# Patient Record
Sex: Male | Born: 1960 | Race: Black or African American | Hispanic: No | Marital: Married | Smoking: Never smoker
Health system: Southern US, Community
[De-identification: ages and names within clinical notes are randomized; demographics above are authoritative.]

## PROBLEM LIST (undated history)

## (undated) DIAGNOSIS — I10 Essential (primary) hypertension: Secondary | ICD-10-CM

## (undated) HISTORY — PX: OTHER SURGICAL HISTORY: SHX169

---

## 2021-10-27 ENCOUNTER — Other Ambulatory Visit: Payer: Self-pay | Admitting: Urology

## 2021-11-13 NOTE — Progress Notes (Addendum)
Anesthesia Review:  PCP: DR Hungarland in [place]  No past EKG on pt at office of DR Hungarland.  Cardiologist : none  Chest x-ray : EKG : 11/15/21  Echo : Stress test: Cardiac Cath :  Activity level: can do a flight of stairs without difficulty  Sleep Study/ CPAP : none  Fasting Blood Sugar :      / Checks Blood Sugar -- times a day:   Blood Thinner/ Instructions /Last Dose: ASA / Instructions/ Last Dose :   81 mg Aspirin  Covid test on 11/20/21  EKG done 11/15/21 shown to [facility].  No old ekg on file at PCP office.   NO new orders given.

## 2021-11-13 NOTE — Progress Notes (Addendum)
DUE TO COVID-19 ONLY ONE VISITOR IS ALLOWED TO COME WITH YOU AND STAY IN THE WAITING ROOM ONLY DURING PRE OP AND PROCEDURE DAY OF SURGERY. THE 1 VISITOR  MAY VISIT WITH YOU AFTER SURGERY IN YOUR PRIVATE ROOM DURING VISITING HOURS ONLY!  YOU NEED TO HAVE A COVID 19 TEST ON___  11/20/21 ____ @_______ , THIS TEST MUST BE DONE BEFORE SURGERY,  COVID TESTING SITE IS AT [place]. PLEASE REMAIN IN YOUR CAR THIS IS A DRIVER UP TEST. AFTER YOUR COVID TEST PLEASE WEAR A MASK OUT IN PUBLIC AND SOCIAL DISTANCE AND [place] YOUR HANDS FREQUENTLY. PLEASE ASK ALL YOUR CLOSE CONTACTS TO WEAR A MASK OUT IN PUBLIC AND SOCIAL DISTANCE AND [place] HANDS FREQUENTLY ALSO.               Mark Figueroa  11/13/2021   Your procedure is scheduled on:  11/22/21   Report to [facility] Main  Entrance   Report to admitting at   [phone]     Call this number if you have problems the morning of surgery [phone]    Remember: Do not eat food , candy gum or mints :After Midnight. You may have clear liquids from midnight until __ 0430am  CLEAR LIQUID DIET THE DAY BEFORE SURGERY.  Mix  1/2 BOTTLE OF mIRALAX  with 32 OUNCES OF GATORADE AT 12 NOON DAY BEFORE SURGERY.  GLASS EVERY 15-30 MINUTES UNTIL GONE.  THEN REPEAT AT 6PM   ( The bottle of Miralax is 255 g.     CLEAR LIQUID DIET   Foods Allowed                                                                       Coffee and tea, regular and decaf                              Plain Jell-O any favor except red or purple                                            Fruit ices (not with fruit pulp)                                      Iced Popsicles                                     Carbonated beverages, regular and diet                                    Cranberry, grape and apple juices Sports drinks like Gatorade Lightly seasoned clear broth or consume(fat  free) Sugar   _____________________________________________________________________    BRUSH YOUR TEETH MORNING OF SURGERY AND RINSE YOUR MOUTH OUT, NO CHEWING GUM CANDY OR MINTS.     Take these medicines  the morning of surgery with A SIP OF WATER:   NONE  DO NOT TAKE ANY DIABETIC MEDICATIONS DAY OF YOUR SURGERY                               You may not have any metal on your body including hair pins and              piercings  Do not wear jewelry, make-up, lotions, powders or perfumes, deodorant             Do not wear nail polish on your fingernails.  Do not shave  48 hours prior to surgery.              Men may shave face and neck.   Do not bring valuables to the hospital. [place].  Contacts, dentures or bridgework may not be worn into surgery.  Leave suitcase in the car. After surgery it may be brought to your room.     Patients discharged the day of surgery will not be allowed to drive home. IF YOU ARE HAVING SURGERY AND GOING HOME THE SAME DAY, YOU MUST HAVE AN ADULT TO DRIVE YOU HOME AND BE WITH YOU FOR 24 HOURS. YOU MAY GO HOME BY TAXI OR UBER OR ORTHERWISE, BUT AN ADULT MUST ACCOMPANY YOU HOME AND STAY WITH YOU FOR 24 HOURS.  Name and phone number of your driver:  Special Instructions: N/A              Please read over the following fact sheets you were given: _____________________________________________________________________  [facility] - Preparing for Surgery Before surgery, you can play an important role.  Because skin is not sterile, your skin needs to be as free of germs as possible.  You can reduce the number of germs on your skin by washing with CHG (chlorahexidine gluconate) soap before surgery.  CHG is an antiseptic cleaner which kills germs and bonds with the skin to continue killing germs even after washing. Please DO NOT use if you have an allergy to CHG or antibacterial soaps.  If your skin becomes  reddened/irritated stop using the CHG and inform your nurse when you arrive at Short Stay. Do not shave (including legs and underarms) for at least 48 hours prior to the first CHG shower.  You may shave your face/neck. Please follow these instructions carefully:  1.  Shower with CHG Soap the night before surgery and the  morning of Surgery.  2.  If you choose to wash your hair, wash your hair first as usual with your  normal  shampoo.  3.  After you shampoo, rinse your hair and body thoroughly to remove the  shampoo.                           4.  Use CHG as you would any other liquid soap.  You can apply chg directly  to the skin and wash                       Gently with a scrungie or clean washcloth.  5.  Apply the CHG Soap to your body ONLY FROM THE NECK DOWN.   Do not use on face/ open  Wound or open sores. Avoid contact with eyes, ears mouth and genitals (private parts).                       Wash face,  Genitals (private parts) with your normal soap.             6.  Wash thoroughly, paying special attention to the area where your surgery  will be performed.  7.  Thoroughly rinse your body with warm water from the neck down.  8.  DO NOT shower/wash with your normal soap after using and rinsing off  the CHG Soap.                9.  Pat yourself dry with a clean towel.            10.  Wear clean pajamas.            11.  Place clean sheets on your bed the night of your first shower and do not  sleep with pets. Day of Surgery : Do not apply any lotions/deodorants the morning of surgery.  Please wear clean clothes to the hospital/surgery center.  FAILURE TO FOLLOW THESE INSTRUCTIONS MAY RESULT IN THE CANCELLATION OF YOUR SURGERY PATIENT SIGNATURE_________________________________  NURSE SIGNATURE__________________________________  ________________________________________________________________________

## 2021-11-15 ENCOUNTER — Encounter (HOSPITAL_COMMUNITY)
Admission: RE | Admit: 2021-11-15 | Discharge: 2021-11-15 | Disposition: A | Discharge status: Home / Self Care | Payer: BC Managed Care – PPO | Source: Ambulatory Visit | Attending: Urology | Admitting: Urology

## 2021-11-15 ENCOUNTER — Encounter (HOSPITAL_COMMUNITY): Payer: Self-pay

## 2021-11-15 ENCOUNTER — Other Ambulatory Visit: Payer: Self-pay

## 2021-11-15 DIAGNOSIS — Z01818 Encounter for other preprocedural examination: Secondary | ICD-10-CM | POA: Insufficient documentation

## 2021-11-15 HISTORY — DX: Essential (primary) hypertension: I10

## 2021-11-15 LAB — COMPREHENSIVE METABOLIC PANEL
ALT: 27 U/L (ref 0–44)
AST: 22 U/L (ref 15–41)
Albumin: 4.3 g/dL (ref 3.5–5.0)
Alkaline Phosphatase: 61 U/L (ref 38–126)
Anion gap: 9 (ref 5–15)
BUN: 17 mg/dL (ref 6–20)
CO2: 25 mmol/L (ref 22–32)
Calcium: 9.2 mg/dL (ref 8.9–10.3)
Chloride: 103 mmol/L (ref 98–111)
Creatinine, Ser: 0.83 mg/dL (ref 0.61–1.24)
GFR, Estimated: >60 mL/min (ref >60)
Glucose, Bld: 95 mg/dL (ref 70–99)
Potassium: 3.8 mmol/L (ref 3.5–5.1)
Sodium: 137 mmol/L (ref 135–145)
Total Bilirubin: 0.9 mg/dL (ref 0.3–1.2)
Total Protein: 7.9 g/dL (ref 6.5–8.1)

## 2021-11-15 LAB — CBC
HCT: 45.3 % (ref 39.0–52.0)
Hemoglobin: 16.1 g/dL (ref 13.0–17.0)
MCH: 27.1 pg (ref 26.0–34.0)
MCHC: 35.5 g/dL (ref 30.0–36.0)
MCV: 76.3 fL — ABNORMAL LOW (ref 80.0–100.0)
Platelets: 323 10*3/uL (ref 150–400)
RBC: 5.94 MIL/uL — ABNORMAL HIGH (ref 4.22–5.81)
RDW: 14.3 % (ref 11.5–15.5)
WBC: 6.1 10*3/uL (ref 4.0–10.5)
nRBC: 0 % (ref 0.0–0.2)

## 2021-11-16 LAB — URINE CULTURE: Culture: NO GROWTH

## 2021-11-20 ENCOUNTER — Other Ambulatory Visit: Payer: Self-pay | Admitting: Urology

## 2021-11-21 LAB — SARS CORONAVIRUS 2 (TAT 6-24 HRS): SARS Coronavirus 2: NEGATIVE

## 2021-11-21 NOTE — Anesthesia Preprocedure Evaluation (Addendum)
Anesthesia Evaluation  Patient identified by MRN, date of birth, ID band Patient awake    Reviewed: Allergy & Precautions, H&P , NPO status , Patient's Chart, lab work & pertinent test results  Airway Mallampati: I  TM Distance: >3 FB Neck ROM: Full    Dental no notable dental hx. (+) Teeth Intact, Dental Advisory Given   Pulmonary neg pulmonary ROS,    Pulmonary exam normal breath sounds clear to auscultation       Cardiovascular Exercise Tolerance: Good hypertension, Pt. on medications Normal cardiovascular exam Rhythm:Regular Rate:Normal     Neuro/Psych negative neurological ROS  negative psych ROS   GI/Hepatic negative GI ROS, Neg liver ROS,   Endo/Other  negative endocrine ROS  Renal/GU negative Renal ROS  negative genitourinary   Musculoskeletal negative musculoskeletal ROS (+)   Abdominal   Peds negative pediatric ROS (+)  Hematology negative hematology ROS (+)   Anesthesia Other Findings   Reproductive/Obstetrics negative OB ROS                            Anesthesia Physical Anesthesia Plan  ASA: 2  Anesthesia Plan: General   Post-op Pain Management: Ofirmev IV (intra-op) and Dilaudid IV   Induction: Intravenous  PONV Risk Score and Plan: 2 and Ondansetron and Dexamethasone  Airway Management Planned: Oral ETT and LMA  Additional Equipment: None  Intra-op Plan:   Post-operative Plan: Extubation in OR  Informed Consent: I have reviewed the patients History and Physical, chart, labs and discussed the procedure including the risks, benefits and alternatives for the proposed anesthesia with the patient or authorized representative who has indicated his/her understanding and acceptance.       Plan Discussed with: Anesthesiologist and CRNA  Anesthesia Plan Comments: (  )       Anesthesia Quick Evaluation

## 2021-11-22 ENCOUNTER — Encounter (HOSPITAL_COMMUNITY): Payer: Self-pay | Admitting: Urology

## 2021-11-22 ENCOUNTER — Ambulatory Visit (HOSPITAL_COMMUNITY): Payer: BC Managed Care – PPO | Admitting: Anesthesiology

## 2021-11-22 ENCOUNTER — Other Ambulatory Visit: Payer: Self-pay

## 2021-11-22 ENCOUNTER — Encounter (HOSPITAL_COMMUNITY)
Admission: RE | Disposition: A | Discharge status: Home / Self Care | Payer: Self-pay | Source: Home / Self Care | Attending: Urology

## 2021-11-22 ENCOUNTER — Ambulatory Visit (HOSPITAL_COMMUNITY): Payer: BC Managed Care – PPO | Admitting: Physician Assistant

## 2021-11-22 ENCOUNTER — Observation Stay (HOSPITAL_COMMUNITY)
Admission: RE | Admit: 2021-11-22 | Discharge: 2021-11-23 | Disposition: A | Discharge status: Home / Self Care | Payer: BC Managed Care – PPO | Attending: Urology | Admitting: Urology

## 2021-11-22 DIAGNOSIS — Z79899 Other long term (current) drug therapy: Secondary | ICD-10-CM | POA: Diagnosis not present

## 2021-11-22 DIAGNOSIS — C61 Malignant neoplasm of prostate: Principal | ICD-10-CM | POA: Insufficient documentation

## 2021-11-22 DIAGNOSIS — I10 Essential (primary) hypertension: Secondary | ICD-10-CM | POA: Diagnosis not present

## 2021-11-22 HISTORY — PX: PELVIC LYMPH NODE DISSECTION: SHX6543

## 2021-11-22 HISTORY — PX: ROBOT ASSISTED LAPAROSCOPIC RADICAL PROSTATECTOMY: SHX5141

## 2021-11-22 LAB — ABO/RH: ABO/RH(D): A POS

## 2021-11-22 LAB — CBC
HCT: 43.8 % (ref 39.0–52.0)
Hemoglobin: 15.6 g/dL (ref 13.0–17.0)
MCH: 27.4 pg (ref 26.0–34.0)
MCHC: 35.6 g/dL (ref 30.0–36.0)
MCV: 77 fL — ABNORMAL LOW (ref 80.0–100.0)
Platelets: 278 10*3/uL (ref 150–400)
RBC: 5.69 MIL/uL (ref 4.22–5.81)
RDW: 14.3 % (ref 11.5–15.5)
WBC: 17.3 10*3/uL — ABNORMAL HIGH (ref 4.0–10.5)
nRBC: 0 % (ref 0.0–0.2)

## 2021-11-22 LAB — HEMOGLOBIN AND HEMATOCRIT, BLOOD
HCT: 44 % (ref 39.0–52.0)
Hemoglobin: 15.8 g/dL (ref 13.0–17.0)

## 2021-11-22 LAB — BASIC METABOLIC PANEL
Anion gap: 11 (ref 5–15)
BUN: 14 mg/dL (ref 6–20)
CO2: 21 mmol/L — ABNORMAL LOW (ref 22–32)
Calcium: 8.7 mg/dL — ABNORMAL LOW (ref 8.9–10.3)
Chloride: 104 mmol/L (ref 98–111)
Creatinine, Ser: 1.09 mg/dL (ref 0.61–1.24)
GFR, Estimated: >60 mL/min (ref >60)
Glucose, Bld: 199 mg/dL — ABNORMAL HIGH (ref 70–99)
Potassium: 3.9 mmol/L (ref 3.5–5.1)
Sodium: 136 mmol/L (ref 135–145)

## 2021-11-22 LAB — TYPE AND SCREEN
ABO/RH(D): A POS
Antibody Screen: NEGATIVE

## 2021-11-22 SURGERY — PROSTATECTOMY, RADICAL, ROBOT-ASSISTED, LAPAROSCOPIC
Anesthesia: General | Site: Abdomen

## 2021-11-22 MED ORDER — BUPIVACAINE LIPOSOME 1.3 % IJ SUSP
INTRAMUSCULAR | Status: AC
  Filled 2021-11-22: qty 20

## 2021-11-22 MED ORDER — GLYCOPYRROLATE 0.2 MG/ML IJ SOLN
INTRAMUSCULAR | Status: DC | PRN
  Administered 2021-11-22 (×2): .1 mg via INTRAVENOUS

## 2021-11-22 MED ORDER — HYDROMORPHONE HCL 1 MG/ML IJ SOLN
0.5000 mg | INTRAMUSCULAR | Status: DC | PRN
  Administered 2021-11-22 (×2): 0.5 mg via INTRAVENOUS
  Filled 2021-11-22 (×2): qty 0.5

## 2021-11-22 MED ORDER — TRAMADOL HCL 50 MG PO TABS: 50.0000 mg | ORAL_TABLET | Freq: Four times a day (QID) | ORAL | 0 refills | Status: DC | PRN

## 2021-11-22 MED ORDER — CEFAZOLIN SODIUM-DEXTROSE 2-4 GM/100ML-% IV SOLN
2.0000 g | INTRAVENOUS | Status: AC
  Administered 2021-11-22 (×2): 2 g via INTRAVENOUS
  Filled 2021-11-22: qty 100

## 2021-11-22 MED ORDER — ACETAMINOPHEN 10 MG/ML IV SOLN
INTRAVENOUS | Status: DC | PRN
  Administered 2021-11-22: 1000 mg via INTRAVENOUS

## 2021-11-22 MED ORDER — DOCUSATE SODIUM 50 MG PO CAPS
50.0000 mg | ORAL_CAPSULE | Freq: Every day | ORAL | Status: DC | PRN
  Filled 2021-11-22: qty 1

## 2021-11-22 MED ORDER — ORAL CARE MOUTH RINSE: 15.0000 mL | Freq: Once | OROMUCOSAL | Status: AC

## 2021-11-22 MED ORDER — CHLORHEXIDINE GLUCONATE CLOTH 2 % EX PADS
6.0000 | MEDICATED_PAD | Freq: Every day | CUTANEOUS | Status: DC
  Administered 2021-11-23: 6 via TOPICAL

## 2021-11-22 MED ORDER — CEFAZOLIN SODIUM-DEXTROSE 1-4 GM/50ML-% IV SOLN
1.0000 g | Freq: Three times a day (TID) | INTRAVENOUS | Status: AC
  Administered 2021-11-22 – 2021-11-23 (×2): 1 g via INTRAVENOUS
  Filled 2021-11-22 (×2): qty 50

## 2021-11-22 MED ORDER — MIDAZOLAM HCL 5 MG/5ML IJ SOLN
INTRAMUSCULAR | Status: DC | PRN
  Administered 2021-11-22: 2 mg via INTRAVENOUS

## 2021-11-22 MED ORDER — HYDRALAZINE HCL 20 MG/ML IJ SOLN
INTRAMUSCULAR | Status: AC
  Filled 2021-11-22: qty 1

## 2021-11-22 MED ORDER — ACETAMINOPHEN 10 MG/ML IV SOLN
1000.0000 mg | Freq: Four times a day (QID) | INTRAVENOUS | Status: DC
Start: 2021-11-22 — End: 2021-11-22

## 2021-11-22 MED ORDER — HYDROMORPHONE HCL 1 MG/ML IJ SOLN
INTRAMUSCULAR | Status: DC | PRN
  Administered 2021-11-22: 1 mg via INTRAVENOUS
  Administered 2021-11-22 (×2): .5 mg via INTRAVENOUS

## 2021-11-22 MED ORDER — ESMOLOL HCL 100 MG/10ML IV SOLN
INTRAVENOUS | Status: DC | PRN
  Administered 2021-11-22 (×2): 10 mg via INTRAVENOUS

## 2021-11-22 MED ORDER — ONDANSETRON HCL 4 MG PO TABS
4.0000 mg | ORAL_TABLET | Freq: Three times a day (TID) | ORAL | Status: DC | PRN
  Administered 2021-11-22: 4 mg via ORAL
  Filled 2021-11-22: qty 1

## 2021-11-22 MED ORDER — KETAMINE HCL 10 MG/ML IJ SOLN
INTRAMUSCULAR | Status: AC
  Filled 2021-11-22: qty 1

## 2021-11-22 MED ORDER — FENTANYL CITRATE PF 50 MCG/ML IJ SOSY
25.0000 ug | PREFILLED_SYRINGE | INTRAMUSCULAR | Status: DC | PRN
  Administered 2021-11-22 (×2): 25 ug via INTRAVENOUS

## 2021-11-22 MED ORDER — ACETAMINOPHEN 10 MG/ML IV SOLN
INTRAVENOUS | Status: AC
  Filled 2021-11-22: qty 100

## 2021-11-22 MED ORDER — BUPIVACAINE LIPOSOME 1.3 % IJ SUSP
INTRAMUSCULAR | Status: DC | PRN
  Administered 2021-11-22: 20 mL

## 2021-11-22 MED ORDER — KETOROLAC TROMETHAMINE 30 MG/ML IJ SOLN
INTRAMUSCULAR | Status: DC | PRN
  Administered 2021-11-22: 30 mg via INTRAVENOUS

## 2021-11-22 MED ORDER — OXYCODONE HCL 5 MG PO TABS: 5.0000 mg | ORAL_TABLET | Freq: Once | ORAL | Status: DC | PRN

## 2021-11-22 MED ORDER — DEXMEDETOMIDINE (PRECEDEX) IN NS 20 MCG/5ML (4 MCG/ML) IV SYRINGE
PREFILLED_SYRINGE | INTRAVENOUS | Status: AC
  Filled 2021-11-22: qty 5

## 2021-11-22 MED ORDER — LACTATED RINGERS IR SOLN
Status: DC | PRN
  Administered 2021-11-22: 1000 mL

## 2021-11-22 MED ORDER — HYDRALAZINE HCL 20 MG/ML IJ SOLN
20.0000 mg | Freq: Once | INTRAMUSCULAR | Status: AC
  Administered 2021-11-22: 20 mg via INTRAVENOUS

## 2021-11-22 MED ORDER — TRAMADOL HCL 50 MG PO TABS
50.0000 mg | ORAL_TABLET | Freq: Four times a day (QID) | ORAL | Status: DC | PRN
  Administered 2021-11-23: 100 mg via ORAL
  Filled 2021-11-22: qty 2

## 2021-11-22 MED ORDER — SODIUM CHLORIDE 0.45 % IV SOLN: INTRAVENOUS | Status: DC

## 2021-11-22 MED ORDER — MEPERIDINE HCL 50 MG/ML IJ SOLN: 6.2500 mg | INTRAMUSCULAR | Status: DC | PRN

## 2021-11-22 MED ORDER — MORPHINE SULFATE (PF) 2 MG/ML IV SOLN: 2.0000 mg | INTRAVENOUS | Status: DC | PRN

## 2021-11-22 MED ORDER — OXYCODONE HCL 5 MG PO TABS: 5.0000 mg | ORAL_TABLET | ORAL | Status: DC | PRN

## 2021-11-22 MED ORDER — OXYCODONE HCL 5 MG/5ML PO SOLN: 5.0000 mg | Freq: Once | ORAL | Status: DC | PRN

## 2021-11-22 MED ORDER — HYDROCHLOROTHIAZIDE 25 MG PO TABS
25.0000 mg | ORAL_TABLET | Freq: Every day | ORAL | Status: DC
  Administered 2021-11-23: 25 mg via ORAL
  Filled 2021-11-22: qty 1

## 2021-11-22 MED ORDER — ONDANSETRON HCL 4 MG/2ML IJ SOLN: 4.0000 mg | INTRAMUSCULAR | Status: DC | PRN

## 2021-11-22 MED ORDER — KETOROLAC TROMETHAMINE 15 MG/ML IJ SOLN
15.0000 mg | Freq: Four times a day (QID) | INTRAMUSCULAR | Status: DC
  Administered 2021-11-22 – 2021-11-23 (×3): 15 mg via INTRAVENOUS
  Filled 2021-11-22 (×3): qty 1

## 2021-11-22 MED ORDER — FENTANYL CITRATE PF 50 MCG/ML IJ SOSY
PREFILLED_SYRINGE | INTRAMUSCULAR | Status: AC
  Filled 2021-11-22: qty 1

## 2021-11-22 MED ORDER — LACTATED RINGERS IV SOLN: INTRAVENOUS | Status: DC

## 2021-11-22 MED ORDER — KETOROLAC TROMETHAMINE 15 MG/ML IJ SOLN
15.0000 mg | Freq: Four times a day (QID) | INTRAMUSCULAR | Status: DC
  Administered 2021-11-22: 15 mg via INTRAVENOUS
  Filled 2021-11-22: qty 1

## 2021-11-22 MED ORDER — ACETAMINOPHEN 10 MG/ML IV SOLN
1000.0000 mg | Freq: Four times a day (QID) | INTRAVENOUS | Status: AC
  Administered 2021-11-22 – 2021-11-23 (×4): 1000 mg via INTRAVENOUS
  Filled 2021-11-22 (×4): qty 100

## 2021-11-22 MED ORDER — STERILE WATER FOR INJECTION IJ SOLN: 20.0000 mg/kg | Freq: Three times a day (TID) | INTRAMUSCULAR | Status: DC

## 2021-11-22 MED ORDER — ESMOLOL HCL 100 MG/10ML IV SOLN
INTRAVENOUS | Status: AC
  Filled 2021-11-22: qty 10

## 2021-11-22 MED ORDER — DEXAMETHASONE SODIUM PHOSPHATE 10 MG/ML IJ SOLN
INTRAMUSCULAR | Status: DC | PRN
  Administered 2021-11-22: 10 mg via INTRAVENOUS

## 2021-11-22 MED ORDER — ROCURONIUM BROMIDE 100 MG/10ML IV SOLN
INTRAVENOUS | Status: DC | PRN
Start: 2021-11-22 — End: 2021-11-22
  Administered 2021-11-22: 100 mg via INTRAVENOUS
  Administered 2021-11-22 (×2): 20 mg via INTRAVENOUS
  Administered 2021-11-22 (×2): 10 mg via INTRAVENOUS

## 2021-11-22 MED ORDER — POLYETHYLENE GLYCOL 3350 17 GM/SCOOP PO POWD: 1.0000 | Freq: Once | ORAL | Status: DC

## 2021-11-22 MED ORDER — MIDAZOLAM HCL 2 MG/2ML IJ SOLN
INTRAMUSCULAR | Status: AC
  Filled 2021-11-22: qty 2

## 2021-11-22 MED ORDER — ACETAMINOPHEN 160 MG/5ML PO SOLN: 325.0000 mg | ORAL | Status: DC | PRN

## 2021-11-22 MED ORDER — CEFAZOLIN SODIUM 1 G IJ SOLR
INTRAMUSCULAR | Status: AC
  Filled 2021-11-22: qty 20

## 2021-11-22 MED ORDER — KETOROLAC TROMETHAMINE 30 MG/ML IJ SOLN
INTRAMUSCULAR | Status: AC
  Filled 2021-11-22: qty 1

## 2021-11-22 MED ORDER — KETAMINE HCL 10 MG/ML IJ SOLN
INTRAMUSCULAR | Status: DC | PRN
  Administered 2021-11-22 (×3): 10 mg via INTRAVENOUS

## 2021-11-22 MED ORDER — CHLORHEXIDINE GLUCONATE 0.12 % MT SOLN
15.0000 mL | Freq: Once | OROMUCOSAL | Status: AC
  Administered 2021-11-22: 15 mL via OROMUCOSAL

## 2021-11-22 MED ORDER — FENTANYL CITRATE (PF) 100 MCG/2ML IJ SOLN
INTRAMUSCULAR | Status: DC | PRN
  Administered 2021-11-22 (×5): 50 ug via INTRAVENOUS

## 2021-11-22 MED ORDER — ATORVASTATIN CALCIUM 20 MG PO TABS
20.0000 mg | ORAL_TABLET | Freq: Every day | ORAL | Status: DC
  Administered 2021-11-23: 20 mg via ORAL
  Filled 2021-11-22: qty 1

## 2021-11-22 MED ORDER — PROPOFOL 10 MG/ML IV BOLUS
INTRAVENOUS | Status: DC | PRN
  Administered 2021-11-22: 160 mg via INTRAVENOUS

## 2021-11-22 MED ORDER — BUPIVACAINE-EPINEPHRINE 0.5% -1:200000 IJ SOLN
INTRAMUSCULAR | Status: DC | PRN
  Administered 2021-11-22: 37 mL
  Administered 2021-11-22: 13 mL

## 2021-11-22 MED ORDER — ONDANSETRON HCL 4 MG/2ML IJ SOLN
INTRAMUSCULAR | Status: AC
  Filled 2021-11-22: qty 2

## 2021-11-22 MED ORDER — LIDOCAINE 2% (20 MG/ML) 5 ML SYRINGE
INTRAMUSCULAR | Status: DC | PRN
  Administered 2021-11-22: 100 mg via INTRAVENOUS

## 2021-11-22 MED ORDER — ACETAMINOPHEN 325 MG PO TABS: 325.0000 mg | ORAL_TABLET | ORAL | Status: DC | PRN

## 2021-11-22 MED ORDER — LACTATED RINGERS IV SOLN: INTRAVENOUS | Status: DC | PRN

## 2021-11-22 MED ORDER — HYDROMORPHONE HCL 2 MG/ML IJ SOLN
INTRAMUSCULAR | Status: AC
  Filled 2021-11-22: qty 1

## 2021-11-22 MED ORDER — LABETALOL HCL 5 MG/ML IV SOLN
INTRAVENOUS | Status: DC | PRN
  Administered 2021-11-22: 5 mg via INTRAVENOUS

## 2021-11-22 MED ORDER — OXYBUTYNIN CHLORIDE 5 MG PO TABS: 5.0000 mg | ORAL_TABLET | Freq: Three times a day (TID) | ORAL | Status: DC | PRN

## 2021-11-22 MED ORDER — DEXMEDETOMIDINE (PRECEDEX) IN NS 20 MCG/5ML (4 MCG/ML) IV SYRINGE
PREFILLED_SYRINGE | INTRAVENOUS | Status: DC | PRN
  Administered 2021-11-22: 4 ug via INTRAVENOUS
  Administered 2021-11-22: 8 ug via INTRAVENOUS

## 2021-11-22 MED ORDER — FENTANYL CITRATE (PF) 250 MCG/5ML IJ SOLN
INTRAMUSCULAR | Status: AC
  Filled 2021-11-22: qty 5

## 2021-11-22 MED ORDER — SODIUM CHLORIDE 0.9 % IV SOLN: Freq: Once | INTRAVENOUS | Status: AC

## 2021-11-22 MED ORDER — SUGAMMADEX SODIUM 200 MG/2ML IV SOLN
INTRAVENOUS | Status: DC | PRN
Start: 2021-11-22 — End: 2021-11-22
  Administered 2021-11-22: 300 mg via INTRAVENOUS

## 2021-11-22 MED ORDER — ONDANSETRON HCL 4 MG/2ML IJ SOLN: 4.0000 mg | Freq: Once | INTRAMUSCULAR | Status: DC | PRN

## 2021-11-22 MED ORDER — 0.9 % SODIUM CHLORIDE (POUR BTL) OPTIME
TOPICAL | Status: DC | PRN
  Administered 2021-11-22: 1000 mL

## 2021-11-22 MED ORDER — ROCURONIUM BROMIDE 10 MG/ML (PF) SYRINGE
PREFILLED_SYRINGE | INTRAVENOUS | Status: AC
  Filled 2021-11-22: qty 10

## 2021-11-22 MED ORDER — ZOLPIDEM TARTRATE 5 MG PO TABS
5.0000 mg | ORAL_TABLET | Freq: Every evening | ORAL | Status: DC | PRN
  Administered 2021-11-22: 5 mg via ORAL
  Filled 2021-11-22: qty 1

## 2021-11-22 MED ORDER — PROPOFOL 10 MG/ML IV BOLUS
INTRAVENOUS | Status: AC
  Filled 2021-11-22: qty 40

## 2021-11-22 MED ORDER — HYDRALAZINE HCL 20 MG/ML IJ SOLN
5.0000 mg | INTRAMUSCULAR | Status: DC | PRN
  Administered 2021-11-22: 5 mg via INTRAVENOUS
  Filled 2021-11-22: qty 1

## 2021-11-22 MED ORDER — LIDOCAINE HCL (PF) 2 % IJ SOLN
INTRAMUSCULAR | Status: AC
  Filled 2021-11-22: qty 5

## 2021-11-22 MED ORDER — ONDANSETRON HCL 4 MG/2ML IJ SOLN
INTRAMUSCULAR | Status: DC | PRN
  Administered 2021-11-22 (×2): 4 mg via INTRAVENOUS

## 2021-11-22 MED ORDER — BUPIVACAINE-EPINEPHRINE 0.5% -1:200000 IJ SOLN
INTRAMUSCULAR | Status: AC
  Filled 2021-11-22: qty 1

## 2021-11-22 MED ORDER — SULFAMETHOXAZOLE-TRIMETHOPRIM 800-160 MG PO TABS: 1.0000 | ORAL_TABLET | Freq: Two times a day (BID) | ORAL | 0 refills | Status: DC

## 2021-11-22 MED ORDER — CEFAZOLIN SODIUM-DEXTROSE 1-4 GM/50ML-% IV SOLN
1.0000 g | Freq: Three times a day (TID) | INTRAVENOUS | Status: AC
  Filled 2021-11-22: qty 50

## 2021-11-22 SURGICAL SUPPLY — 61 items
APPLICATOR COTTON TIP 6 STRL (MISCELLANEOUS) ×2 IMPLANT
APPLICATOR COTTON TIP 6IN STRL (MISCELLANEOUS) ×3
BAG COUNTER SPONGE SURGICOUNT (BAG) IMPLANT
CATH FOLEY 2WAY SLVR 18FR 30CC (CATHETERS) ×3 IMPLANT
CATH TIEMANN FOLEY 18FR 5CC (CATHETERS) ×3 IMPLANT
CHLORAPREP W/TINT 26 (MISCELLANEOUS) ×3 IMPLANT
CLIP LIGATING HEM O LOK PURPLE (MISCELLANEOUS) ×18 IMPLANT
COVER SURGICAL LIGHT HANDLE (MISCELLANEOUS) ×3 IMPLANT
COVER TIP SHEARS 8 DVNC (MISCELLANEOUS) ×2 IMPLANT
COVER TIP SHEARS 8MM DA VINCI (MISCELLANEOUS) ×1
CUTTER ECHEON FLEX ENDO 45 340 (ENDOMECHANICALS) ×3 IMPLANT
DECANTER SPIKE VIAL GLASS SM (MISCELLANEOUS) ×3 IMPLANT
DERMABOND ADVANCED (GAUZE/BANDAGES/DRESSINGS) ×1
DERMABOND ADVANCED .7 DNX12 (GAUZE/BANDAGES/DRESSINGS) ×2 IMPLANT
DRAIN CHANNEL RND F F (WOUND CARE) IMPLANT
DRAPE ARM DVNC X/XI (DISPOSABLE) ×8 IMPLANT
DRAPE COLUMN DVNC XI (DISPOSABLE) ×2 IMPLANT
DRAPE DA VINCI XI ARM (DISPOSABLE) ×4
DRAPE DA VINCI XI COLUMN (DISPOSABLE) ×1
DRAPE SURG IRRIG POUCH 19X23 (DRAPES) ×3 IMPLANT
DRSG TEGADERM 4X4.75 (GAUZE/BANDAGES/DRESSINGS) ×3 IMPLANT
ELECT PENCIL ROCKER SW 15FT (MISCELLANEOUS) ×3 IMPLANT
ELECT REM PT RETURN 15FT ADLT (MISCELLANEOUS) ×3 IMPLANT
GAUZE 4X4 16PLY ~~LOC~~+RFID DBL (SPONGE) IMPLANT
GAUZE SPONGE 2X2 8PLY STRL LF (GAUZE/BANDAGES/DRESSINGS) IMPLANT
GAUZE SPONGE 4X4 12PLY STRL (GAUZE/BANDAGES/DRESSINGS) ×3 IMPLANT
GLOVE SURG ENC MOIS LTX SZ6.5 (GLOVE) ×3 IMPLANT
GLOVE SURG ENC TEXT LTX SZ7.5 (GLOVE) ×6 IMPLANT
GOWN STRL REUS W/TWL LRG LVL3 (GOWN DISPOSABLE) ×3 IMPLANT
GOWN STRL REUS W/TWL XL LVL3 (GOWN DISPOSABLE) ×6 IMPLANT
HOLDER FOLEY CATH W/STRAP (MISCELLANEOUS) ×3 IMPLANT
IRRIG SUCT STRYKERFLOW 2 WTIP (MISCELLANEOUS) ×3
IRRIGATION SUCT STRKRFLW 2 WTP (MISCELLANEOUS) ×2 IMPLANT
IV LACTATED RINGERS 1000ML (IV SOLUTION) ×3 IMPLANT
KIT TURNOVER KIT A (KITS) IMPLANT
PACK ROBOT UROLOGY CUSTOM (CUSTOM PROCEDURE TRAY) ×3 IMPLANT
PAD POSITIONING PINK XL (MISCELLANEOUS) ×3 IMPLANT
PENCIL SMOKE EVACUATOR (MISCELLANEOUS) IMPLANT
SCISSORS LAP 5X35 DISP (ENDOMECHANICALS) ×3 IMPLANT
SEAL CANN UNIV 5-8 DVNC XI (MISCELLANEOUS) ×8 IMPLANT
SEAL XI 5MM-8MM UNIVERSAL (MISCELLANEOUS) ×4
SET TUBE SMOKE EVAC HIGH FLOW (TUBING) ×3 IMPLANT
SOLUTION ELECTROLUBE (MISCELLANEOUS) ×3 IMPLANT
SPONGE GAUZE 2X2 STER 10/PKG (GAUZE/BANDAGES/DRESSINGS)
STAPLE RELOAD 45 GRN (STAPLE) ×2 IMPLANT
STAPLE RELOAD 45MM GREEN (STAPLE) ×1
SUT ETHILON 3 0 PS 1 (SUTURE) ×3 IMPLANT
SUT MNCRL AB 4-0 PS2 18 (SUTURE) ×6 IMPLANT
SUT PDS AB 1 CTX 36 (SUTURE) ×3 IMPLANT
SUT V-LOC BARB 180 2/0GR6 GS22 (SUTURE) ×3
SUT VIC AB 0 CT1 27 (SUTURE) ×1
SUT VIC AB 0 CT1 27XBRD ANTBC (SUTURE) ×2 IMPLANT
SUT VIC AB 2-0 PS2 27 (SUTURE) ×3 IMPLANT
SUT VICRYL 0 UR6 27IN ABS (SUTURE) ×6 IMPLANT
SUT VLOC BARB 180 ABS3/0GR12 (SUTURE) ×6
SUTURE V-LC BRB 180 2/0GR6GS22 (SUTURE) ×2 IMPLANT
SUTURE VLOC BRB 180 ABS3/0GR12 (SUTURE) ×4 IMPLANT
TOWEL OR NON WOVEN STRL DISP B (DISPOSABLE) ×3 IMPLANT
TROCAR XCEL 12X100 BLDLESS (ENDOMECHANICALS) ×3 IMPLANT
TROCAR XCEL NON-BLD 5MMX100MML (ENDOMECHANICALS) ×3 IMPLANT
WATER STERILE IRR 1000ML POUR (IV SOLUTION) ×3 IMPLANT

## 2021-11-22 NOTE — Anesthesia Procedure Notes (Signed)
Procedure Name: Intubation Date/Time: 11/22/2021 7:29 AM Performed by: Gwyndolyn Saxon, CRNA Pre-anesthesia Checklist: Patient identified, Emergency Drugs available, Suction available and Patient being monitored Patient Re-evaluated:Patient Re-evaluated prior to induction Oxygen Delivery Method: Circle system utilized Preoxygenation: Pre-oxygenation with 100% oxygen Induction Type: IV induction Ventilation: Mask ventilation without difficulty Laryngoscope Size: Miller and 2 Grade View: Grade I Tube type: Oral Tube size: 7.5 mm Number of attempts: 1 Airway Equipment and Method: Patient positioned with wedge pillow and Stylet Placement Confirmation: ETT inserted through vocal cords under direct vision, positive ETCO2 and breath sounds checked- equal and bilateral Secured at: 23 cm Tube secured with: Tape Dental Injury: Teeth and Oropharynx as per pre-operative assessment

## 2021-11-22 NOTE — Discharge Instructions (Signed)
Activity:  You are encouraged to ambulate frequently (about every hour during waking hours) to help prevent blood clots from forming in your legs or lungs.  However, you should not engage in any heavy lifting (> 10-15 lbs), strenuous activity, or straining.  Diet: You should advance your diet as instructed by your physician.  It will be normal to have some bloating, nausea, and abdominal discomfort intermittently.  Prescriptions:  You will be provided a prescription for pain medication to take as needed.  If your pain is not severe enough to require the prescription pain medication, you may take extra strength Tylenol instead which will have less side effects.  You should also take a prescribed stool softener to avoid straining with bowel movements as the prescription pain medication may constipate you.  Incisions: You may remove your dressing bandages 48 hours after surgery if not removed in the hospital.  You will either have some small staples or special tissue glue at each of the incision sites. Once the bandages are removed (if present), the incisions may stay open to air.  You may start showering (but not soaking or bathing in water) the 2nd day after surgery and the incisions simply need to be patted dry after the shower.  No additional care is needed.  What to call us about: You should call the office [phone]) if you develop fever > 101 or develop persistent vomiting. Activity:  You are encouraged to ambulate frequently (about every hour during waking hours) to help prevent blood clots from forming in your legs or lungs.  However, you should not engage in any heavy lifting (> 10-15 lbs), strenuous activity, or straining.    Foley Catheter Care A soft, flexible tube (Foley catheter) may have been placed in your bladder to drain urine and fluid. Follow these instructions: Taking Care of the Catheter Keep the area where the catheter leaves your body clean.  Attach the catheter to the leg so  there is no tension on the catheter.  Keep the drainage bag below the level of the bladder, but keep it OFF the floor.  Do not take long soaking baths. Your caregiver will give instructions about showering.  Wash your hands before touching ANYTHING related to the catheter or bag.  Using mild soap and warm water on a washcloth:  Clean the area closest to the catheter insertion site using a circular motion around the catheter.  Clean the catheter itself by wiping AWAY from the insertion site for several inches down the tube.  NEVER wipe upward as this could sweep bacteria up into the urethra (tube in your body that normally drains the bladder) and cause infection.  Place a small amount of sterile lubricant at the tip of the penis where the catheter is entering.  Taking Care of the Drainage Bags Two drainage bags may be taken home: a large overnight drainage bag, and a smaller leg bag which fits underneath clothing.  It is okay to wear the overnight bag at any time, but NEVER wear the smaller leg bag at night.  Keep the drainage bag well below the level of your bladder. This prevents backflow of urine into the bladder and allows the urine to drain freely.  Anchor the tubing to your leg to prevent pulling or tension on the catheter. Use tape or a leg strap provided by the hospital.  Empty the drainage bag when it is 1/2 to 3/4 full. Wash your hands before and after touching the bag.  Periodically  check the tubing for kinks to make sure there is no pressure on the tubing which could restrict the flow of urine.  Changing the Drainage Bags Cleanse both ends of the clean bag with alcohol before changing.  Pinch off the rubber catheter to avoid urine spillage during the disconnection.  Disconnect the dirty bag and connect the clean one.  Empty the dirty bag carefully to avoid a urine spill.  Attach the new bag to the leg with tape or a leg strap.  Cleaning the Drainage Bags Whenever a drainage bag is  disconnected, it must be cleaned quickly so it is ready for the next use.  Wash the bag in warm, soapy water.  Rinse the bag thoroughly with warm water.  Soak the bag for 30 minutes in a solution of white vinegar and water (1 cup vinegar to 1 quart warm water).  Rinse with warm water.  SEEK MEDICAL CARE IF:  You have chills or night sweats.  You are leaking around your catheter or have problems with your catheter. It is not uncommon to have sporadic leakage around your catheter as a result of bladder spasms. If the leakage stops, there is not much need for concern. If you are uncertain, call your caregiver.  You develop side effects that you think are coming from your medicines.  SEEK IMMEDIATE MEDICAL CARE IF:  You are suddenly unable to urinate. Check to see if there are any kinks in the drainage tubing that may cause this. If you cannot find any kinks, call your caregiver immediately. This is an emergency.  You develop shortness of breath or chest pains.  Bleeding persists or clots develop in your urine.  You have a fever.  You develop pain in your back or over your lower belly (abdomen).  You develop pain or swelling in your legs.  Any problems you are having get worse rather than better.  MAKE SURE YOU:  Understand these instructions.  Will watch your condition.  Will get help right away if you are not doing well or get worse.

## 2021-11-22 NOTE — Anesthesia Postprocedure Evaluation (Signed)
Anesthesia Post Note  Patient: Mark Figueroa  Procedure(s) Performed: XI ROBOTIC ASSISTED LAPAROSCOPIC RADICAL PROSTATECTOMY (Abdomen) BILATERAL PELVIC LYMPH NODE DISSECTION (Bilateral: Abdomen)     Patient location during evaluation: PACU Anesthesia Type: General Level of consciousness: awake and alert Pain management: pain level controlled Vital Signs Assessment: post-procedure vital signs reviewed and stable Respiratory status: spontaneous breathing, nonlabored ventilation, respiratory function stable and patient connected to nasal cannula oxygen Cardiovascular status: blood pressure returned to baseline and stable Postop Assessment: no apparent nausea or vomiting Anesthetic complications: no   No notable events documented.  Last Vitals:  Vitals:   11/22/21 1345 11/22/21 1350  BP: (!) 134/95 135/87  Pulse: 93 96  Resp: 12 18  Temp:    SpO2: 100% 100%    Last Pain:  Vitals:   11/22/21 1350  TempSrc:   PainSc: Asleep                 Zimal Weisensel

## 2021-11-22 NOTE — H&P (Signed)
60 year old male who presents today for further discussion of his prostate cancer. He was recently diagnosed with Gleason 4+5 equal 9 prostate cancer on his first prostate biopsy.   6/6 cores positive on the right side for Gleason 4+5 = 9 prostate cancer.   IPSS: 4, QoL 1  SHIM: 23 (with medication)   TRUS: 24 g, right lobe with several hypoechoic areas and lots of microcalcifications   The patient's past medical history is significant for hypertension. He has no past surgical history.   15-year cancer specific survival: 97%  Progression free probability after radical prostatectomy: At 5-year 50%, at 10-year 35%  Lymph nodal involvement: 20%:     ALLERGIES: No Allergies    MEDICATIONS: Aspirin Ec 81 mg tablet, delayed release  Losartan Potassium  Multivitamin  Rosuvastatin Calcium     GU PSH: Prostate Needle Biopsy - 09/07/2021     NON-GU PSH: Surgical Pathology, Gross And Microscopic Examination For Prostate Needle - 09/07/2021     GU PMH: Elevated PSA - 09/07/2021, - 07/11/2021    NON-GU PMH: Hypercholesterolemia Hypertension    FAMILY HISTORY: 1 Daughter - Daughter   SOCIAL HISTORY: Marital Status: Married Preferred Language: English; Ethnicity: Not Hispanic Or Latino; Race: Black or African American Current Smoking Status: Patient has never smoked.   Tobacco Use Assessment Completed: Used Tobacco in last 30 days? Moderate Drinker.  Drinks 1 caffeinated drink per day.    REVIEW OF SYSTEMS:    GU Review Male:   Patient reports frequent urination and hard to postpone urination. Patient denies burning/ pain with urination, get up at night to urinate, leakage of urine, stream starts and stops, trouble starting your stream, have to strain to urinate , erection problems, and penile pain.  Gastrointestinal (Upper):   Patient denies nausea, vomiting, and indigestion/ heartburn.  Gastrointestinal (Lower):   Patient denies diarrhea and constipation.  Constitutional:   Patient  denies fever, night sweats, weight loss, and fatigue.  Skin:   Patient denies skin rash/ lesion and itching.  Eyes:   Patient denies double vision and blurred vision.  Ears/ Nose/ Throat:   Patient denies sore throat and sinus problems.  Hematologic/Lymphatic:   Patient denies swollen glands and easy bruising.  Cardiovascular:   Patient denies leg swelling and chest pains.  Respiratory:   Patient reports cough. Patient denies shortness of breath.  Endocrine:   Patient denies excessive thirst.  Musculoskeletal:   Patient denies back pain and joint pain.  Neurological:   Patient denies headaches and dizziness.  Psychologic:   Patient denies depression and anxiety.   VITAL SIGNS:      10/27/2021 11:20 AM  BP 134/87 mmHg  Heart Rate 65 /min  Temperature 97.1 F / 36.1 C   Complexity of Data:  Source Of History:  Patient  Lab Test Review:   PSA  Records Review:   Pathology Reports, Previous Doctor Records, Previous Patient Records, POC Tool  Urine Test Review:   Urinalysis  X-Ray Review: Prostate Ultrasound: Reviewed Films. Discussed With Patient.     07/11/21 05/11/21  PSA  Total PSA 5.65 ng/mL 5.7 ng/ml    PROCEDURES: None   ASSESSMENT:      ICD-10 Details  1 GU:   Prostate Cancer - C61      PLAN:           Orders X-Rays: PET Scan - New diagnosis of high-grade prostate cancer          Schedule Return Visit/Planned Activity: ASAP -  PT Referral             Note: Preop for radical prostatectomy          Document Letter(s):  Created for Patient: Clinical Summary    The patient was counseled about the natural history of prostate cancer and the standard treatment options that are available for prostate cancer. It was explained to him how his age and life expectancy, clinical stage, Gleason score, and PSA affect his prognosis, the decision to proceed with additional staging studies, as well as how that information influences recommended treatment strategies. We discussed the  roles for active surveillance, radiation therapy, surgical therapy, androgen deprivation, as well as ablative therapy options for the treatment of prostate cancer as appropriate to his individual cancer situation. We discussed the risks and benefits of these options with regard to their impact on cancer control and also in terms of potential adverse events, complications, and impact on quality of life particularly related to urinary, bowel, and sexual function. The patient was encouraged to ask questions throughout the discussion today and all questions were answered to his stated satisfaction. In addition, the patient was provided with and/or directed to appropriate resources and literature for further education about prostate cancer and treatment options.   We discussed prostatectomy and specifically robotic prostatectomy with bilateral pelvic lymphadenectomy. I showed the patient on their abdomen the approximately 6 small incision (trocar) sites as well as presumed extraction sites with robotic approach as well as possible open incision sites should open conversion be necessary. We discussed peri-operative risks including bleeding, infection, deep vein thrombosis, pulmonary embolism, compartment syndrome, neuropathy / neuropraxia, heart attack, stroke, death, as well as long-term risks such as non-cure / need for additional therapy. We specifically addressed that the procedure would compromise urinary control leading to stress incontinence which typically resolves with time and pelvic rehabilitation (Kegel's, etc..), but can sometimes be permanent and require additional therapy including surgery. We also specifically addressed sexual side-effects including significant erectile dysfunction which typically partially resolves with time but can also be permanent and require additional therapy including surgery.   We discussed the typical hospital course including usual 1-2 night hospitalization, discharge with  foley catheter in place usually for 1-2 weeks before voiding trial as well as usually 2 week recovery until able to perform most non-strenuous activity and 6 weeks until able to return to most jobs and more strenuous activity such as exercise.         Notes:   I reviewed the patient's pathology report with him. I explained to him his is of high-grade nature. Given his young age I feel strongly that his best option initially is surgery, as he will likely need radiation for recurrence in the future. Ultimately, after reviewing prostate cancer and the treatment options he and his wife have opted to proceed with radical prostatectomy. He will need a PMSA scan prior to his surgery. He will also need to see our physical therapist.

## 2021-11-22 NOTE — Transfer of Care (Signed)
Immediate Anesthesia Transfer of Care Note  Patient: [name]  Procedure(s) Performed: XI ROBOTIC ASSISTED LAPAROSCOPIC RADICAL PROSTATECTOMY (Abdomen) BILATERAL PELVIC LYMPH NODE DISSECTION (Bilateral: Abdomen)  Patient Location: PACU  Anesthesia Type:General  Level of Consciousness: drowsy  Airway & Oxygen Therapy: Patient Spontanous Breathing and Patient connected to face mask oxygen  Post-op Assessment: Report given to RN and Post -op Vital signs reviewed and stable  Post vital signs: Reviewed and stable  Last Vitals:  Vitals Value Taken Time  BP 165/99 11/22/21 1246  Temp    Pulse 81 11/22/21 1253  Resp 27 11/22/21 1253  SpO2 97 % 11/22/21 1253  Vitals shown include unvalidated device data.  Last Pain:  Vitals:   11/22/21 0557  TempSrc:   PainSc: 7       Patients Stated Pain Goal: 3 (16/10/96 0454)  Complications: No notable events documented.

## 2021-11-22 NOTE — Interval H&P Note (Signed)
History and Physical Interval Note:  11/22/2021 7:13 AM  Mark Figueroa  has presented today for surgery, with the diagnosis of PROSTATE CANCER.  The various methods of treatment have been discussed with the patient and family. After consideration of risks, benefits and other options for treatment, the patient has consented to  Procedure(s) with comments: XI [place] (N/A) - REQUESTING 4.5 HRS BILATERAL PELVIC LYMPH NODE DISSECTION (Bilateral) as a surgical intervention.  The patient's history has been reviewed, patient examined, no change in status, stable for surgery.  I have reviewed the patient's chart and labs.  Questions were answered to the patient's satisfaction.     Ardis Hughs

## 2021-11-22 NOTE — Op Note (Signed)
Preoperative diagnosis:  Prostate Cancer  Umbilical hernia  Postoperative diagnosis:  same   Procedure: Robotic assisted laparoscopic radical prostatectomy Bilateral extensive pelvic lymph node dissection  Surgeon: Ardis Hughs, MD First Assistant: Aldine Contes, MD  Anesthesia: General  Complications: None  Intraoperative findings:  #1: The right posterior lateral pedicle appeared to be grossly involved with extension of his prostate cancer.  Ultimately, the margin was grossly negative. #2: I performed an extended pelvic lymph node dissection up to the bifurcation of the iliac vessels bilaterally. #3: The patient had what appeared to be a lipoma within the umbilical hernia sac.  This was resected and the defect closed primarily with a 1-0 PDS. #4: A left nerve spare was performed.  EBL: Minimal  Specimens:  #1.  Prostate and seminal vesicals #2.  Bilateral pelvic lymph nodes #3.  Umbilical hernia sac  Indication: Mark Figueroa is a 60 y.o. patient with prostate cancer.  After reviewing the management options for treatment, he elected to proceed with the removal of his prostate. We have discussed the potential benefits and risks of the procedure, side effects of the proposed treatment, the likelihood of the patient achieving the goals of the procedure, and any potential problems that might occur during the procedure or recuperation. Informed consent has been obtained.  Description of procedure:  The patient was consented in the preoperative holding area. He was in brought back to the operating room placed the table in supine position. General anesthesia was then induced and endotracheal tube was inserted. He was then placed in dorsolithotomy position and placed in steep Trendelenburg. He was then prepped and draped in the routine sterile fashion. We, the first assistant and I, then began by making a 10 mm incision supraumbilical midline incision the skin down through into the  peritoneum. Then placed a 8 mm trocar. I then inflated the abdomen and inserted the 0 robotic lens. We then placed 2 additional a 8 millimeter trochars in the patient's left lower abdomen proximally 9 cm apart and 2 trochars on the patient's right lower abdomen, one was an 8 mm trocar and the one most lateral was a 12 mm trocar which was used as the assistant port. A 5 mm trocar was placed by triangulating the 2 right lateral ports as a second assistant port. These ports were all placed under visual guidance. Once the ports were noted to be satisfactory position the robot was docked. We started with the 0 lens, monopolar scissors in the right hand and the [place] forceps the left hand as well as a fenestrated grasper as the third arm on the left-hand side.   We, the first assistant and I,  began our dissection the posterior plane incising the peritoneum at the level of the vas deferens. Isolated the left vas deferens and dissected it proximally towards the spermatic cord for 5 cm prior to ligating it. Then used this as traction to isolate the left the seminal vesicle which was then undressed bluntly and completely dissected out, all vessels were cauterized with a combination of bipolar and the monopolar scissors. We then turned our attention to the right side and similarly dissected out the right vas deferens and seminal vesicle. Once the SVs had been freed, we turned our attention to the posterior plane and bluntly dissected the tissue between the rectum and the posterior wall of the prostate bluntly out towards the apex.    At this point the bladder was taken down starting at the urachal remnant with  a combination of both blunt dissection and sharp dissection using monopolar cautery the bladder was dropped down in the usual fashion to the medial umbilical ligaments laterally and the dorsal vein of the prostate anteriorly creating our space of Retzius. We then turned our attention to the endopelvic fascia  which was incised laterally starting on the patient's right-hand side the levator muscles were pushed off the prostate laterally up towards the dorsal vein complex on the right-hand side. This process was then repeated on the left-hand side and a nice notch was created for the dorsal vein. I then used a 15mm stapler to staple the dorsal vein.   We,the first assistant and I, then located the bladder neck at the vesicoprostatic junction and using the monopolar scissors dissected down through the perivesical tissues and the bladder neck down to the prostatic urethra. The catheter was then deflated and pulled through our urethral opening and then used to retract the prostate anteriorly for the posterior bladder neck dissection. Once through the bladder neck and into the posterior plane of the prostate, the SVs were brought through the opening. The left pedicle was then isolated and systematically ligated with Weck clips and scissors. The nerve bundle was then peeled off the posterior lateral aspect of the prostate and bluntly dissected away off the prostate.  Coming around the posterior lateral pedicle of the right there appeared to be tumor involving the margins and extending into the pedicle.  I was able to get all the way around it, and the margin at this location appeared to be grossly negative once the dissection was complete.  I then came down through the dorsal venous complex anteriorly down to the membranous urethra using the monopolar. Once down to the urethra, it was transected sharply and the apex of the prostate was then dissected off the levator and rectourethralis muscles. Once the apex of the prostate had been dissected free we came back to the base of the prostate and bluntly push the rectum and nerve vascular bundle off the prostate the patient's left and used clips on the patient's right to free the prostate. Once the prostate was free it was placed off to the side. The pelvis was then irrigated  with normal saline and noted to be relatively hemostatic.  Attention was then turned to the right pelvic sidewall. The fibrofatty tissue between the external iliac vein, confluence of the iliac vessels, hypogastric artery, and Cooper's ligament was dissected free from the pelvic sidewall with care to preserve the obturator nerve. Weck clips were used for lymphostasis and hemostasis. An identical procedure was performed on the contralateral side and the lymphatic packets were removed for permanent pathologic analysis.  The prostate and both lymph node tissues were placed in the Endo Catch bag and the string brought to the 5 mm port.    I then performed an extended Rocco stitch reapproximating the posterior bladder wall adjacent to the bladder neck with tenotomy's fascia in 2 layers.  The vesicourethral anastomosis was then completed with 2 interlocking 3-0 V. lock sutures running the anastomosis in the 6:00 position to the 12:00 position on each side and then tying it off on the top. The final catheter was then passed through the patient's urethra and into the bladder and 120 cc was instilled into the bladder to test the anastomosis. As there was no leak a 64 [place] Blake drain was passed through the left lateral port and placed around the vesicourethral anastomosis. A 12 mm assistant port on  the right lateral side was then closed with 0 Vicryl with the help of the [employer] needle. The 12 mm midline infraumbilical incision was then extended another centimeter taken down and the fascia opened to remove the Endo Catch bag with the prostate specimen.  I extended this incision down to the umbilicus and opened up the fascia underneath.  We resected the hernia sac, tied it off with a 0 Vicryl suture tie.  We then reapproximated the posterior fascia with a 1-0 PDS in a running fashion.  All skin ports were closed with 4-0 Monocryl in a subcutaneous fashion. Dermabond glue was then applied to the incisions. The  drain was then secured to the skin with a 0 nylon stitch and dressing applied.   At the end of the case all laps needles and sponges had been accounted for. There no immediate complications. The patient returned to the PACU in stable condition.

## 2021-11-23 ENCOUNTER — Encounter (HOSPITAL_COMMUNITY): Payer: Self-pay | Admitting: Urology

## 2021-11-23 DIAGNOSIS — C61 Malignant neoplasm of prostate: Secondary | ICD-10-CM | POA: Diagnosis not present

## 2021-11-23 LAB — CBC
HCT: 39.4 % (ref 39.0–52.0)
Hemoglobin: 14.3 g/dL (ref 13.0–17.0)
MCH: 27.3 pg (ref 26.0–34.0)
MCHC: 36.3 g/dL — ABNORMAL HIGH (ref 30.0–36.0)
MCV: 75.2 fL — ABNORMAL LOW (ref 80.0–100.0)
Platelets: 285 10*3/uL (ref 150–400)
RBC: 5.24 MIL/uL (ref 4.22–5.81)
RDW: 14.2 % (ref 11.5–15.5)
WBC: 10.2 10*3/uL (ref 4.0–10.5)
nRBC: 0 % (ref 0.0–0.2)

## 2021-11-23 LAB — BASIC METABOLIC PANEL
Anion gap: 10 (ref 5–15)
BUN: 13 mg/dL (ref 6–20)
CO2: 24 mmol/L (ref 22–32)
Calcium: 8.9 mg/dL (ref 8.9–10.3)
Chloride: 101 mmol/L (ref 98–111)
Creatinine, Ser: 1.02 mg/dL (ref 0.61–1.24)
GFR, Estimated: >60 mL/min (ref >60)
Glucose, Bld: 114 mg/dL — ABNORMAL HIGH (ref 70–99)
Potassium: 3.5 mmol/L (ref 3.5–5.1)
Sodium: 135 mmol/L (ref 135–145)

## 2021-11-23 NOTE — Discharge Summary (Signed)
Date of admission: 11/22/2021  Date of discharge: 11/23/2021  Admission diagnosis: prostate cancer  Discharge diagnosis: prostate cancer  Secondary diagnoses:  Patient Active Problem List   Diagnosis Date Noted   Prostate cancer ([place]) 11/22/2021    Procedures performed: Procedure(s): XI ROBOTIC ASSISTED LAPAROSCOPIC RADICAL PROSTATECTOMY BILATERAL PELVIC LYMPH NODE DISSECTION  History and Physical: For full details, please see admission history and physical. Briefly, Mark Figueroa is a 60 y.o. year old patient with prostate cancer who underwent robotic assisted laparoscopic radical prostatectomy and lymph node dissection.     Hospital Course: Patient tolerated the procedure well.  He was then transferred to the floor after an uneventful PACU stay.  His hospital course was uncomplicated.  On POD#1 he had met discharge criteria: was eating a regular diet, was up and ambulating independently,  pain was well controlled, catheter was draining well, and was ready to for discharge.  JP drain removed prior to d/c.   Drain ouput: 100/110/30   Laboratory values:  Recent Labs    11/22/21 1412 11/22/21 1446 11/23/21 0342  WBC  --  17.3* 10.2  HGB 15.8 15.6 14.3  HCT 44.0 43.8 39.4   Recent Labs    11/22/21 1412 11/23/21 0342  NA 136 135  K 3.9 3.5  CL 104 101  CO2 21* 24  GLUCOSE 199* 114*  BUN 14 13  CREATININE 1.09 1.02  CALCIUM 8.7* 8.9   No results for input(s): LABPT, INR in the last 72 hours. No results for input(s): LABURIN in the last 72 hours. Results for orders placed or performed in visit on 11/20/21  SARS Coronavirus 2 (TAT 6-24 hrs)     Status: None   Collection Time: 11/20/21 12:00 AM  Result Value Ref Range Status   SARS Coronavirus 2 RESULT: NEGATIVE  Final    Comment: RESULT: NEGATIVESARS-CoV-2 INTERPRETATION:A NEGATIVE  test result means that SARS-CoV-2 RNA was not present in the specimen above the limit of detection of this test. This does not preclude a  possible SARS-CoV-2 infection and should not be used as the  sole basis for patient management decisions. Negative results must be combined with clinical observations, patient history, and epidemiological information. Optimum specimen types and timing for peak viral levels during infections caused by SARS-CoV-2  have not been determined. Collection of multiple specimens or types of specimens may be necessary to detect virus. Improper specimen collection and handling, sequence variability under primers/probes, or organism present below the limit of detection may  lead to false negative results. Positive and negative predictive values of testing are highly dependent on prevalence. False negative test results are more likely when prevalence of disease is high.The expected result is NEGATIVE.Fact S heet for  Healthcare Providers: LocalChronicle.no Sheet for Patients: SalonLookup.es Reference Range - Negative     Disposition: Home  Discharge instruction: The patient was instructed to be ambulatory but told to refrain from heavy lifting, strenuous activity, or driving.   Discharge medications:  Allergies as of 11/23/2021   No Known Allergies      Medication List     TAKE these medications    aspirin EC 81 MG tablet Take 81 mg by mouth daily. Swallow whole.   atorvastatin 20 MG tablet Commonly known as: LIPITOR Take 20 mg by mouth daily.   hydrochlorothiazide 25 MG tablet Commonly known as: HYDRODIURIL Take 25 mg by mouth daily.   losartan 100 MG tablet Commonly known as: COZAAR Take 100 mg by mouth daily.   sulfamethoxazole-trimethoprim 800-160  MG tablet Commonly known as: BACTRIM DS Take 1 tablet by mouth 2 (two) times daily. Start taking one day prior to your appointment for your first follow-up and catheter removal.  Continue taking for three days.   tadalafil 20 MG tablet Commonly known as: CIALIS Take 20 mg by  mouth daily as needed for erectile dysfunction.   traMADol 50 MG tablet Commonly known as: Ultram Take 1-2 tablets (50-100 mg total) by mouth every 6 (six) hours as needed for moderate pain.        Followup:   Follow-up Information     Karen Kays, NP Follow up on 11/29/2021.   Specialty: Nurse Practitioner Why: 10AM, Catheter removal Contact information: [address] [place] [address] [place] [address] [phone]

## 2021-11-27 LAB — SURGICAL PATHOLOGY

## 2022-05-09 ENCOUNTER — Other Ambulatory Visit (HOSPITAL_COMMUNITY): Payer: Self-pay | Admitting: Urology

## 2022-05-09 DIAGNOSIS — C61 Malignant neoplasm of prostate: Secondary | ICD-10-CM

## 2022-05-11 ENCOUNTER — Encounter (HOSPITAL_COMMUNITY): Payer: BC Managed Care – PPO

## 2022-05-14 ENCOUNTER — Encounter (HOSPITAL_COMMUNITY)
Admission: RE | Admit: 2022-05-14 | Discharge: 2022-05-14 | Disposition: A | Discharge status: Home / Self Care | Payer: BC Managed Care – PPO | Source: Ambulatory Visit | Attending: Urology | Admitting: Urology

## 2022-05-14 DIAGNOSIS — C61 Malignant neoplasm of prostate: Secondary | ICD-10-CM | POA: Insufficient documentation

## 2022-05-14 MED ORDER — PIFLIFOLASTAT F 18 (PYLARIFY) INJECTION
9.0000 | Freq: Once | INTRAVENOUS | Status: AC
  Administered 2022-05-14: 8.02 via INTRAVENOUS

## 2022-05-21 ENCOUNTER — Telehealth: Payer: Self-pay | Admitting: Oncology

## 2022-05-21 NOTE — Telephone Encounter (Signed)
Scheduled appt per 5/22 referral. Pt is aware of appt date and time. Pt is aware to arrive 15 mins prior to appt time and to bring and updated insurance card. Pt is aware of appt location.   

## 2022-06-06 ENCOUNTER — Telehealth: Payer: Self-pay | Admitting: Pharmacy Technician

## 2022-06-06 ENCOUNTER — Other Ambulatory Visit: Payer: Self-pay

## 2022-06-06 ENCOUNTER — Other Ambulatory Visit (HOSPITAL_COMMUNITY): Payer: Self-pay

## 2022-06-06 ENCOUNTER — Inpatient Hospital Stay: Payer: BC Managed Care – PPO

## 2022-06-06 ENCOUNTER — Encounter: Payer: Self-pay | Admitting: Oncology

## 2022-06-06 ENCOUNTER — Encounter (INDEPENDENT_AMBULATORY_CARE_PROVIDER_SITE_OTHER): Payer: Self-pay

## 2022-06-06 ENCOUNTER — Telehealth: Payer: Self-pay

## 2022-06-06 ENCOUNTER — Telehealth: Payer: Self-pay | Admitting: Radiation Oncology

## 2022-06-06 ENCOUNTER — Inpatient Hospital Stay: Payer: BC Managed Care – PPO | Attending: Oncology | Admitting: Oncology

## 2022-06-06 VITALS — BP 120/80 | HR 85 | Temp 97.7°F | Resp 17 | Ht 67.0 in | Wt 212.9 lb

## 2022-06-06 DIAGNOSIS — C61 Malignant neoplasm of prostate: Secondary | ICD-10-CM

## 2022-06-06 DIAGNOSIS — Z79899 Other long term (current) drug therapy: Secondary | ICD-10-CM | POA: Diagnosis not present

## 2022-06-06 DIAGNOSIS — E876 Hypokalemia: Secondary | ICD-10-CM | POA: Diagnosis not present

## 2022-06-06 DIAGNOSIS — I10 Essential (primary) hypertension: Secondary | ICD-10-CM | POA: Diagnosis not present

## 2022-06-06 LAB — CMP (CANCER CENTER ONLY)
ALT: 23 U/L (ref 0–44)
AST: 23 U/L (ref 15–41)
Albumin: 4.2 g/dL (ref 3.5–5.0)
Alkaline Phosphatase: 51 U/L (ref 38–126)
Anion gap: 10 (ref 5–15)
BUN: 22 mg/dL — ABNORMAL HIGH (ref 6–20)
CO2: 26 mmol/L (ref 22–32)
Calcium: 9.7 mg/dL (ref 8.9–10.3)
Chloride: 104 mmol/L (ref 98–111)
Creatinine: 0.93 mg/dL (ref 0.61–1.24)
GFR, Estimated: >60 mL/min (ref >60)
Glucose, Bld: 95 mg/dL (ref 70–99)
Potassium: 3.7 mmol/L (ref 3.5–5.1)
Sodium: 140 mmol/L (ref 135–145)
Total Bilirubin: 0.8 mg/dL (ref 0.3–1.2)
Total Protein: 8 g/dL (ref 6.5–8.1)

## 2022-06-06 LAB — CBC WITH DIFFERENTIAL (CANCER CENTER ONLY)
Abs Immature Granulocytes: 0.02 10*3/uL (ref 0.00–0.07)
Basophils Absolute: 0.1 10*3/uL (ref 0.0–0.1)
Basophils Relative: 1 %
Eosinophils Absolute: 0.1 10*3/uL (ref 0.0–0.5)
Eosinophils Relative: 2 %
HCT: 42.9 % (ref 39.0–52.0)
Hemoglobin: 15.9 g/dL (ref 13.0–17.0)
Immature Granulocytes: 0 %
Lymphocytes Relative: 24 %
Lymphs Abs: 1.8 10*3/uL (ref 0.7–4.0)
MCH: 27.4 pg (ref 26.0–34.0)
MCHC: 37.1 g/dL — ABNORMAL HIGH (ref 30.0–36.0)
MCV: 74 fL — ABNORMAL LOW (ref 80.0–100.0)
Monocytes Absolute: 0.7 10*3/uL (ref 0.1–1.0)
Monocytes Relative: 9 %
Neutro Abs: 4.8 10*3/uL (ref 1.7–7.7)
Neutrophils Relative %: 64 %
Platelet Count: 264 10*3/uL (ref 150–400)
RBC: 5.8 MIL/uL (ref 4.22–5.81)
RDW: 14.9 % (ref 11.5–15.5)
WBC Count: 7.5 10*3/uL (ref 4.0–10.5)
nRBC: 0 % (ref 0.0–0.2)

## 2022-06-06 MED ORDER — PREDNISONE 5 MG PO TABS: 5.0000 mg | ORAL_TABLET | Freq: Every day | ORAL | 1 refills | Status: DC

## 2022-06-06 MED ORDER — ABIRATERONE ACETATE 250 MG PO TABS
1000.0000 mg | ORAL_TABLET | Freq: Every day | ORAL | 0 refills | Status: DC
  Filled 2022-06-06: qty 120, 30d supply, fill #0

## 2022-06-06 NOTE — Telephone Encounter (Signed)
Oral Oncology Patient Advocate Encounter   Received notification that prior authorization for Abiraterone [facility]) is required.   PA submitted on 06/06/2022 Submitted by fax to [employer] at [phone] Status is pending     Lady Deutscher, [place] [facility] [place] Patient Advocate Team Direct Number: [phone]  Fax: [phone]

## 2022-06-06 NOTE — Telephone Encounter (Signed)
Oral Oncology Pharmacist Encounter  Received new prescription for abiraterone (Zytiga) for the treatment of advanced, castration-sensitive prostate cancer in conjunction with prednisone, planned duration until disease progression or unacceptable toxicity.  Labs from 6/7 (CBC) assessed, CMP in progress. Prescription dose and frequency assessed.   Current medication list in Epic reviewed, DDIs with zytiga identified: none  Evaluated chart and no patient barriers to medication adherence noted.   Patient agreement for treatment documented in MD note on 06/06/22.  Prescription has been e-scribed to the [facility] for benefits analysis and approval.  Oral Oncology Clinic will continue to follow for insurance authorization, copayment issues, initial counseling and start date.  Drema Halon, PharmD Hematology/Oncology Clinical Pharmacist [facility] [phone] 06/06/2022 2:48 PM

## 2022-06-06 NOTE — Progress Notes (Signed)
Reason for the request:   Prostate cancer  HPI: I was asked by Dr. Louis Meckel to evaluate Mark Figueroa for the evaluation of fall advanced prostate cancer.  He is a 61 year old man who was found to have an elevated PSA in September 2022.  At that time a biopsy obtained by Dr. Louis Meckel and found to have a PSA of 5.7 and a biopsy in September 2022 showed a Gleason score 4+5 = 9.  Based on these findings, he underwent robotic assisted laparoscopic radical prostatectomy and bilateral extensive lymph node dose dissection on November 22, 2021.  The final pathology showed prostate adenocarcinoma Gleason score 9 with extraprostatic extension and lymphovascular invasion.  Margins focally positive with a 0/8 total lymph nodes showed malignancy.  The final pathological staging was T3bN0.  His follow-up PSA in January 2023 was 1.18 and on April 16, 2022 was 28.  This was repeated on April 23, 2022 was 27.6.  Based on these findings he underwent PSMA pet imaging which showed soft tissue mass within the central low-attenuation along the right pelvic sidewall measuring 2.8 x 4.1 cm with SUV of 4.53 and a left pelvic sidewall lymph node measuring 1.6 x 1.5 cm with SUV of 2.84.  Clinically, he reports of feeling well overall without any major complaints.  He does have issues with incontinence although has improved but not completely dry.  He does report some mild nocturia and frequency.  He denies any bone pain or pelvic discomfort.  He does not report any headaches, blurry vision, syncope or seizures. Does not report any fevers, chills or sweats.  Does not report any cough, wheezing or hemoptysis.  Does not report any chest pain, palpitation, orthopnea or leg edema.  Does not report any nausea, vomiting or abdominal pain.  Does not report any constipation or diarrhea.  Does not report any skeletal complaints.    Does not report frequency, urgency or hematuria.  Does not report any skin rashes or lesions. Does not report any heat  or cold intolerance.  Does not report any lymphadenopathy or petechiae.  Does not report any anxiety or depression.  Remaining review of systems is negative.     Past Medical History:  Diagnosis Date   Hypertension   :   Past Surgical History:  Procedure Laterality Date   left arm biceps surgery     PELVIC LYMPH NODE DISSECTION Bilateral 11/22/2021   Procedure: BILATERAL PELVIC LYMPH NODE DISSECTION;  Surgeon: Ardis Hughs, MD;  Location: [facility];  Service: Urology;  Laterality: Bilateral;   ROBOT ASSISTED LAPAROSCOPIC RADICAL PROSTATECTOMY N/A 11/22/2021   Procedure: XI ROBOTIC ASSISTED LAPAROSCOPIC RADICAL PROSTATECTOMY;  Surgeon: Ardis Hughs, MD;  Location: [facility];  Service: Urology;  Laterality: N/A;  REQUESTING 4.5 HRS  :   Current Outpatient Medications:    aspirin EC 81 MG tablet, Take 81 mg by mouth daily. Swallow whole., Disp: , Rfl:    atorvastatin (LIPITOR) 20 MG tablet, Take 20 mg by mouth daily., Disp: , Rfl:    hydrochlorothiazide (HYDRODIURIL) 25 MG tablet, Take 25 mg by mouth daily., Disp: , Rfl:    losartan (COZAAR) 100 MG tablet, Take 100 mg by mouth daily., Disp: , Rfl:    sulfamethoxazole-trimethoprim (BACTRIM DS) 800-160 MG tablet, Take 1 tablet by mouth 2 (two) times daily. Start taking one day prior to your appointment for your first follow-up and catheter removal.  Continue taking for three days., Disp: 6 tablet, Rfl: 0   tadalafil (CIALIS) 20 MG  tablet, Take 20 mg by mouth daily as needed for erectile dysfunction., Disp: , Rfl:    traMADol (ULTRAM) 50 MG tablet, Take 1-2 tablets (50-100 mg total) by mouth every 6 (six) hours as needed for moderate pain., Disp: 30 tablet, Rfl: 0:  No Known Allergies:  No family history on file.:   Social History   Socioeconomic History   Marital status: Married    Spouse name: Not on file   Number of children: Not on file   Years of education: Not on file   Highest education level: Not on file   Occupational History   Not on file  Tobacco Use   Smoking status: Never   Smokeless tobacco: Never  Vaping Use   Vaping Use: Never used  Substance and Sexual Activity   Alcohol use: Yes    Comment: 5 cans nightly   Drug use: Never   Sexual activity: Not on file  Other Topics Concern   Not on file  Social History Narrative   Not on file   Social Determinants of Health   Financial Resource Strain: Not on file  Food Insecurity: Not on file  Transportation Needs: Not on file  Physical Activity: Not on file  Stress: Not on file  Social Connections: Not on file  Intimate Partner Violence: Not on file  :  Pertinent items are noted in HPI.  Exam: Blood pressure 120/80, pulse 85, temperature 97.7 F (36.5 C), temperature source Temporal, resp. rate 17, height '5\' 7"'  (1.702 m), weight 212 lb 14.4 oz (96.6 kg), SpO2 100 %. ECOG 1 General appearance: alert and cooperative appeared without distress. Head: atraumatic without any abnormalities. Eyes: conjunctivae/corneas clear. PERRL.  Sclera anicteric. Throat: lips, mucosa, and tongue normal; without oral thrush or ulcers. Resp: clear to auscultation bilaterally without rhonchi, wheezes or dullness to percussion. Cardio: regular rate and rhythm, S1, S2 normal, no murmur, click, rub or gallop GI: soft, non-tender; bowel sounds normal; no masses,  no organomegaly Skin: Skin color, texture, turgor normal. No rashes or lesions Lymph nodes: Cervical, supraclavicular, and axillary nodes normal. Neurologic: Grossly normal without any motor, sensory or deep tendon reflexes. Musculoskeletal: No joint deformity or effusion.    NM PET (PSMA) SKULL TO MID THIGH  Result Date: 05/16/2022 CLINICAL DATA:  Prostate carcinoma with biochemical recurrence. EXAM: NUCLEAR MEDICINE PET SKULL BASE TO THIGH TECHNIQUE: 8.02 mCi F18 Piflufolastat (Pylarify) was injected intravenously. Full-ring PET imaging was performed from the skull base to thigh after  the radiotracer. CT data was obtained and used for attenuation correction and anatomic localization. COMPARISON:  None Available. FINDINGS: NECK No radiotracer activity in neck lymph nodes. Incidental CT finding: None CHEST No radiotracer accumulation within mediastinal or hilar lymph nodes. No suspicious pulmonary nodules on the CT scan. Incidental CT finding: Cardiac enlargement. Three vessel coronary artery calcifications. Aortic atherosclerosis. Small hiatal hernia. ABDOMEN/PELVIS Prostate: No focal activity in the prostate bed. Lymph nodes: Soft tissue mass with central low attenuation along the right pelvic sidewall measures 2.8 x 4.1 cm with SUV max of 4.53, image 194/4. Left pelvic sidewall lymph node measures 1.6 by 1.5 cm with SUV max of 2.84, image 195/4. Liver: No evidence of liver metastasis Incidental CT finding: None SKELETON No focal  activity to suggest skeletal metastasis. IMPRESSION: 1. Right pelvic sidewall soft tissue mass with central low attenuation is tracer avid with SUV max of 4.53. Suspicious for necrotic lymph node metastasis. 2. Enlarged left pelvic sidewall lymph node with maximum short axis of  1.5 cm exhibits mild tracer uptake with SUV max of 2.84. Despite the mild tracer uptake morphologically this is suspicious for nodal metastasis. 3. No signs of distant metastatic disease. 4. Aortic Atherosclerosis (ICD10-I70.0). Coronary artery calcifications 5. Cardiac enlargement. Electronically Signed   By: Kerby Moors M.D.   On: 05/16/2022 08:14    Assessment and Plan:   61 year old with:  1.  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  He had initially presented with a Gleason score 9 and PSA 5 localized prostate cancer and underwent prostatectomy and November 2023.  The natural course of this disease and treatment options were discussed.   He clearly has advanced disease by high rise in his PSA and the fact that he has not achieved undetectable PSA after radical prostatectomy indicate a overall poor prognostic sign.  The treatment of this disease was discussed today in detail.  Androgen deprivation therapy remains the cornerstone for treatment of advanced prostate cancer.  Therapy escalation was also discussed and given his high risk disease.  It would be reasonable to consider that.  Risks and benefits of androgen receptor pathway inhibitors specifically abiraterone and prednisone were discussed today.  These complications including hypertension, fluid retention and fatigue.  Improvement in overall survival with median of at least 13 months has been noted with this agent in this setting.  After discussion he is agreeable to proceed.  2.  Androgen deprivation therapy: He will receive Firmagon and transition to Eligard at a later date.  Complications including weight gain and hot flashes sexual dysfunction were discussed.  3.  Genetic counseling: He will be referred for counseling given his advanced disease.  We will also obtain genomic testing to look for somatic mutation especially BRCA mutation which could be helpful in the future.  4.  Local therapy considerations: Given his limited metastatic disease, we will obtain a radiation oncology opinion regarding potential role for salvage radiation at this particular setting given his limited metastatic disease.  5.  Hypertension: Blood pressure is under excellent control and will monitor on Zytiga.  6.  Hypokalemia considerations: We will obtain baseline potassium today and will monitor closely.  He is already on hydrochlorothiazide which could exacerbate his hypocalcemia.  7.  Follow-up: We will be in the next 4 weeks to assess his progress.  60  minutes were dedicated to this visit. The time was spent on reviewing laboratory data, imaging studies, discussing treatment options,  and  answering questions regarding future plan.    A copy of this consult has been forwarded to the requesting physician.

## 2022-06-06 NOTE — Progress Notes (Signed)
Introduced myself to the patient, and his wife, as the prostate nurse navigator.  No barriers to care identified at this time.  He is here to discuss his treatment options with Dr. Alen Blew, which he decided to proceed with Zytiga and RadOnc consult.  I gave him my business card and asked him to call me with questions or concerns.  Verbalized understanding.

## 2022-06-06 NOTE — Telephone Encounter (Signed)
6/7 @ 3:08 pm Spoke to patient; was on the highway at time of call.  Patient would like a call back in about 10 mins to be schedule for consult with Dr. Tammi Klippel.

## 2022-06-07 ENCOUNTER — Telehealth: Payer: Self-pay | Admitting: Radiation Oncology

## 2022-06-07 LAB — PROSTATE-SPECIFIC AG, SERUM (LABCORP): Prostate Specific Ag, Serum: 36.8 ng/mL — ABNORMAL HIGH (ref 0.0–4.0)

## 2022-06-07 NOTE — Telephone Encounter (Signed)
F/U call; 6/8 @ 8:49 am Left voicemail for patient to call our office to be schedule for a consult with Dr. Tammi Klippel.

## 2022-06-08 ENCOUNTER — Other Ambulatory Visit (HOSPITAL_COMMUNITY): Payer: Self-pay

## 2022-06-08 MED ORDER — ABIRATERONE ACETATE 250 MG PO TABS: 1000.0000 mg | ORAL_TABLET | Freq: Every day | ORAL | 0 refills | Status: DC

## 2022-06-08 NOTE — Telephone Encounter (Signed)
Oral Oncology Patient Advocate Encounter  Prior Authorization for Abiraterone Fabio Asa) has been approved.    PA# [id number] Effective dates: 06/06/2022 through 12/06/2022  Patient must fill at [facility].    Lady Deutscher, CPhT-Adv Pharmacy Patient Advocate Specialist [place] Patient Advocate Team Direct Number: [phone]  Fax: [phone]

## 2022-06-11 NOTE — Progress Notes (Signed)
GU Location of Tumor / Histology: Prostate Ca  If Prostate Cancer, Gleason Score is (4 + 5) and PSA is (27.6 04/23/2022, 28 on 04/16/2022, and 1.18 on 11/2021)  05/14/2022 CLINICAL DATA:  Prostate carcinoma with biochemical recurrence.   FINDINGS:  NECK No radiotracer activity in neck lymph nodes.   Incidental CT finding: None   CHEST No radiotracer accumulation within mediastinal or hilar lymph nodes.  No suspicious pulmonary nodules on the CT scan.  Incidental CT finding: Cardiac enlargement. Three vessel coronary artery calcifications. Aortic atherosclerosis. Small hiatal hernia.   ABDOMEN/PELVIS   Prostate: No focal activity in the prostate bed.   Lymph nodes: Soft tissue mass with central low attenuation along the right pelvic sidewall measures 2.8 x 4.1 cm with SUV max of 4.53, image 194/4.   Left pelvic sidewall lymph node measures 1.6 by 1.5 cm with SUV max of 2.84, image 195/4.   Liver: No evidence of liver metastasis   Incidental CT finding: None   SKELETON No focal  activity to suggest skeletal metastasis.   IMPRESSION: 1. Right pelvic sidewall soft tissue mass with central low attenuation is tracer avid with SUV max of 4.53. Suspicious for necrotic lymph node metastasis. 2. Enlarged left pelvic sidewall lymph node with maximum short axis of 1.5 cm exhibits mild tracer uptake with SUV max of 2.84. Despite the mild tracer uptake morphologically this is suspicious for nodal metastasis. 3. No signs of distant metastatic disease. 4. Aortic Atherosclerosis (ICD10-I70.0). Coronary artery calcifications 5. Cardiac enlargement.    Past/Anticipated interventions by urology, if any: NA  Past/Anticipated interventions by medical oncology, if any: NA  Weight changes, if any: No   IPSS:  7 SHIM:  6  Bowel/Bladder complaints, if any:  No  Nausea/Vomiting, if any: No  Pain issues, if any:  0/10  SAFETY ISSUES: Prior radiation? No Pacemaker/ICD? No Possible current  pregnancy? Male Is the patient on methotrexate? No  Current Complaints / other details:  Robotic Radical Prostatectomy and bilateral extensive lymph node dose dissection on 11/22/2021.

## 2022-06-12 NOTE — Telephone Encounter (Signed)
Oral Chemotherapy Pharmacist Encounter  I spoke with patient for overview of: Zytiga for the treatment of advanced, castration-sensitive prostate cancer in conjunction with prednisone, planned duration until disease progression or unacceptable toxicity.   Counseled patient on administration, dosing, side effects, monitoring, drug-food interactions, safe handling, storage, and disposal.  Patient will take Zytiga '250mg'$  tablets, 4 tablets ('1000mg'$ ) by mouth once daily on an empty stomach, 1 hour before or 2 hours after a meal.  Patient states he will take his Zytiga in the morning and will wait at least 1 hour before eating.  Patient will take prednisone '5mg'$  tablet, 1 tablet by mouth one daily with breakfast.  Zytiga start date: 06/13/22  Adverse effects include but are not limited to: peripheral edema, GI upset, hypertension, hot flashes, fatigue, and arthralgias.    Prednisone prescription has been sent to [facility] on 06/06/22. Patient has already picked up prednisone and knows to start prednisone on the same day as Zytiga start.  Reviewed with patient importance of keeping a medication schedule and plan for any missed doses. No barriers to medication adherence identified.  Medication reconciliation performed and medication/allergy list updated.  Insurance authorization for Mark Figueroa has been obtained. Patient receives medication from [facility].   Patient informed the pharmacy will reach out 5-7 days prior to needing next fill of Zytiga to coordinate continued medication acquisition to prevent break in therapy.  All questions answered.  Mark Figueroa voiced understanding and appreciation.   Medication education handout placed in mail for patient. Patient knows to call the office with questions or concerns. Oral Chemotherapy Clinic phone number provided to patient.   Drema Halon, PharmD Hematology/Oncology Clinical Pharmacist [facility] [phone] 06/12/2022 12:21 PM

## 2022-06-14 ENCOUNTER — Ambulatory Visit
Admission: RE | Admit: 2022-06-14 | Discharge: 2022-06-14 | Disposition: A | Discharge status: Home / Self Care | Payer: BC Managed Care – PPO | Source: Ambulatory Visit | Attending: Radiation Oncology | Admitting: Radiation Oncology

## 2022-06-14 ENCOUNTER — Other Ambulatory Visit: Payer: Self-pay

## 2022-06-14 VITALS — BP 135/91 | HR 67 | Temp 97.0°F | Resp 18 | Ht 67.0 in | Wt 211.5 lb

## 2022-06-14 DIAGNOSIS — S46211S Strain of muscle, fascia and tendon of other parts of biceps, right arm, sequela: Secondary | ICD-10-CM | POA: Insufficient documentation

## 2022-06-14 DIAGNOSIS — Z7982 Long term (current) use of aspirin: Secondary | ICD-10-CM | POA: Diagnosis not present

## 2022-06-14 DIAGNOSIS — Z79899 Other long term (current) drug therapy: Secondary | ICD-10-CM | POA: Insufficient documentation

## 2022-06-14 DIAGNOSIS — C61 Malignant neoplasm of prostate: Secondary | ICD-10-CM

## 2022-06-14 DIAGNOSIS — Z7952 Long term (current) use of systemic steroids: Secondary | ICD-10-CM | POA: Diagnosis not present

## 2022-06-14 DIAGNOSIS — I10 Essential (primary) hypertension: Secondary | ICD-10-CM | POA: Insufficient documentation

## 2022-06-14 DIAGNOSIS — S5000XA Contusion of unspecified elbow, initial encounter: Secondary | ICD-10-CM | POA: Insufficient documentation

## 2022-06-14 NOTE — Progress Notes (Signed)
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    ICD-10-CM   1. Malignant neoplasm of prostate ([place])  C61     2. Prostate cancer [facility])  C61       HISTORY OF PRESENT ILLNESS: Mark Figueroa is a 61 y.o. male seen at the request of Dr. Alen Blew.  He was initially diagnosed with Gleason 4+5 adenocarcinoma of the prostate on biopsy with Dr. Louis Meckel 09/07/2021.  His PSA was 5.7 at diagnosis.  He elected to proceed with prostatectomy and underwent robotic assisted laparoscopic prostatectomy with bilateral lymph node dissection under the care of Dr. Louis Meckel on 11/22/2021.  Final surgical pathology confirmed PT3 pN0, Gleason 4+5 adenocarcinoma of the prostate with a focally positive margin at the right base and lymphovascular invasion but no seminal vesicle invasion or lymph node involvement.  His initial postoperative PSA on 01/22/2022 was 1.18.  A follow-up PSA was performed on 04/16/2022 and was significantly elevated at 28.2.  A confirmatory PSA was performed on 04/23/2022 and remained elevated at 27.6.  This prompted further evaluation with PSMA PET scan that was performed on 05/14/2022 and demonstrates nodal involvement in the right and left pelvic sidewall nodes but no activity in the prostate bed or other evidence of distant metastatic disease.  He met with Dr. Alen Blew on 06/06/2022 and the plan was to start [facility] ADT as well as Zytiga/prednisone.  The patient reports that he has not gotten any injections but confirmed that he did start the Zytiga/prednisone this morning. He has been  kindly referred to [place] today to discuss the role of salvage radiotherapy in the management of his advanced disease.  PREVIOUS RADIATION THERAPY: No  PAST MEDICAL HISTORY:  Past Medical History:  Diagnosis Date   Hypertension       PAST SURGICAL HISTORY: Past Surgical History:  Procedure Laterality Date   left arm biceps surgery     PELVIC LYMPH NODE DISSECTION Bilateral 11/22/2021   Procedure: BILATERAL PELVIC LYMPH NODE DISSECTION;  Surgeon: Ardis Hughs, MD;  Location: [facility];  Service: Urology;  Laterality: Bilateral;   ROBOT ASSISTED LAPAROSCOPIC RADICAL PROSTATECTOMY N/A 11/22/2021   Procedure: XI ROBOTIC ASSISTED LAPAROSCOPIC RADICAL PROSTATECTOMY;  Surgeon: Ardis Hughs, MD;  Location: [facility];  Service: Urology;  Laterality: N/A;  REQUESTING 4.5 HRS    FAMILY HISTORY: No family history on file.  SOCIAL HISTORY:  Social History   Socioeconomic History   Marital status: Married    Spouse name: Not on file   Number of children: Not on file   Years of education: Not on file   Highest education level: Not on file  Occupational History   Not on file  Tobacco Use   Smoking status: Never   Smokeless tobacco: Never  Vaping Use   Vaping Use: Never used  Substance and Sexual Activity   Alcohol use: Yes    Comment: 5 cans nightly   Drug use: Never   Sexual activity: Not on file  Other Topics Concern   Not on file  Social History Narrative   Not on file  Social Determinants of Health   Financial Resource Strain: Not on file  Food Insecurity: Not on file  Transportation Needs: Not on file  Physical Activity: Not on file  Stress: Not on file  Social Connections: Not on file  Intimate Partner Violence: Not on file    ALLERGIES: Patient has no known allergies.  MEDICATIONS:  Current Outpatient Medications  Medication Sig Dispense Refill   abiraterone acetate (ZYTIGA) 250 MG tablet Take 4 tablets (1,000 mg total) by mouth daily. Take on an empty  stomach 1 hour before or 2 hours after a meal 120 tablet 0   atorvastatin (LIPITOR) 20 MG tablet Take 20 mg by mouth daily.     hydrochlorothiazide (HYDRODIURIL) 25 MG tablet Take 25 mg by mouth daily.     losartan (COZAAR) 100 MG tablet Take 100 mg by mouth daily.     predniSONE (DELTASONE) 5 MG tablet Take 1 tablet (5 mg total) by mouth daily with breakfast. 90 tablet 1   tadalafil (CIALIS) 20 MG tablet Take 20 mg by mouth daily as needed for erectile dysfunction.     aspirin EC 81 MG tablet Take 81 mg by mouth daily. Swallow whole. (Patient not taking: Reported on 06/06/2022)     No current facility-administered medications for this encounter.    REVIEW OF SYSTEMS:  On review of systems, the patient reports that he is doing well overall.  He denies any chest pain, shortness of breath, cough, fevers, chills, night sweats, unintended weight changes.  He denies any bowel or bladder disturbances, and denies abdominal pain, nausea or vomiting.  He denies any new musculoskeletal or joint aches or pains. A complete review of systems is obtained and is otherwise negative.    PHYSICAL EXAM:  Wt Readings from Last 3 Encounters:  06/14/22 211 lb 8 oz (95.9 kg)  06/06/22 212 lb 14.4 oz (96.6 kg)  11/22/21 180 lb (81.6 kg)   Temp Readings from Last 3 Encounters:  06/14/22 (!) 97 F (36.1 C) (Temporal)  06/06/22 97.7 F (36.5 C) (Temporal)  11/23/21 99.6 F (37.6 C) (Oral)   BP Readings from Last 3 Encounters:  06/14/22 (!) 135/91  06/06/22 120/80  11/23/21 128/81   Pulse Readings from Last 3 Encounters:  06/14/22 67  06/06/22 85  11/23/21 79   Pain Assessment Pain Score: 0-No pain/10  In general this is a well appearing African-American male in no acute distress.  He's alert and oriented x4 and appropriate throughout the examination. Cardiopulmonary assessment is negative for acute distress and he exhibits normal effort.   KPS = 100  100 - Normal; no complaints; no evidence of  disease. 90   - Able to carry on normal activity; minor signs or symptoms of disease. 80   - Normal activity with effort; some signs or symptoms of disease. 37   - Cares for self; unable to carry on normal activity or to do active work. 60   - Requires occasional assistance, but is able to care for most of his personal needs. 50   - Requires considerable assistance and frequent medical care. 75   - Disabled; requires special care and assistance. 74   - Severely disabled; hospital admission is indicated although death not imminent. 16   - Very sick; hospital admission necessary; active supportive treatment necessary. 10   - Moribund; fatal processes progressing rapidly. 0     - Dead  Karnofsky DA, Abelmann WH, Craver LS and Burchenal [facility] [phone]) The use of the  nitrogen mustards in the palliative treatment of carcinoma: with particular reference to bronchogenic carcinoma Cancer 1 [id number]  LABORATORY DATA:  Lab Results  Component Value Date   WBC 7.5 06/06/2022   HGB 15.9 06/06/2022   HCT 42.9 06/06/2022   MCV 74.0 (L) 06/06/2022   PLT 264 06/06/2022   Lab Results  Component Value Date   NA 140 06/06/2022   K 3.7 06/06/2022   CL 104 06/06/2022   CO2 26 06/06/2022   Lab Results  Component Value Date   ALT 23 06/06/2022   AST 23 06/06/2022   ALKPHOS 51 06/06/2022   BILITOT 0.8 06/06/2022     RADIOGRAPHY: No results found.    IMPRESSION/PLAN: 1. 61 y.o. 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 Today, we talked to the patient and his wife about the findings and workup thus far. We discussed the natural history of oligometastatic prostate cancer and general treatment, highlighting the role of radiotherapy in the management. We discussed the available radiation techniques, and focused on the details and logistics of delivery.  The recommendation  is to proceed with a 7-1/2-week course of daily external beam radiotherapy to the prostate fossa and pelvic lymph nodes, with a boost to the PET positive pelvic nodes, concurrent with ADT and Zytiga/prednisone.  We reviewed the anticipated acute and late sequelae associated with radiation in this setting. The patient was encouraged to ask questions that were answered to his stated satisfaction.  At the conclusion of our conversation, the patient is in agreement to proceed with the recommended 7-1/2-week course of daily external beam radiotherapy to the prostate fossa and pelvic lymph nodes with a boost to the PET positive pelvic nodes, concurrent with ADT and Zytiga/prednisone.  We will follow-up with Dr. Alen Blew regarding the start of ADT since the patient reports that he has not yet received any injection.  He feels like he is still making some gradual progress in regaining his continence, working with physical therapy, and would like to allow a few additional weeks prior to beginning the radiation.  He has freely signed written consent to proceed today in the office and a copy of this document will be placed in his medical record.  We will share our discussion with Dr. Alen Blew and Dr. Louis Meckel and proceed with treatment planning accordingly.  He will be scheduled for CT simulation/treatment planning later this month or early July 2023, in anticipation of beginning his daily treatments in the next 3 to 4 weeks.  We enjoyed meeting him and his wife today and look forward to continuing to participate in his care.   We personally spent 60 minutes in this encounter including chart review, reviewing radiological studies, meeting face-to-face with the patient, entering orders and completing documentation.    Nicholos Johns, PA-C    Tyler Pita, MD  [facility] Direct Dial: [phone]  Fax: [phone] [email or web address]  Skype  LinkedIn

## 2022-06-15 ENCOUNTER — Other Ambulatory Visit: Payer: Self-pay | Admitting: Oncology

## 2022-06-15 NOTE — Progress Notes (Signed)
Patient scheduled for first ADT injection on 6/19.   RN left voicemail for patient to confirm appointment time/date.

## 2022-06-18 ENCOUNTER — Other Ambulatory Visit: Payer: Self-pay

## 2022-06-18 ENCOUNTER — Inpatient Hospital Stay: Payer: BC Managed Care – PPO

## 2022-06-18 ENCOUNTER — Encounter (HOSPITAL_COMMUNITY): Payer: Self-pay | Admitting: Oncology

## 2022-06-18 VITALS — BP 124/74 | HR 82 | Temp 98.5°F | Resp 18

## 2022-06-18 DIAGNOSIS — C61 Malignant neoplasm of prostate: Secondary | ICD-10-CM

## 2022-06-18 MED ORDER — DEGARELIX ACETATE(240 MG DOSE) 120 MG/VIAL ~~LOC~~ SOLR
240.0000 mg | Freq: Once | SUBCUTANEOUS | Status: AC
  Administered 2022-06-18: 240 mg via SUBCUTANEOUS
  Filled 2022-06-18: qty 6

## 2022-06-22 ENCOUNTER — Ambulatory Visit: Payer: BC Managed Care – PPO | Admitting: Radiation Oncology

## 2022-06-29 ENCOUNTER — Other Ambulatory Visit: Payer: Self-pay

## 2022-06-29 DIAGNOSIS — C61 Malignant neoplasm of prostate: Secondary | ICD-10-CM

## 2022-06-29 MED ORDER — ABIRATERONE ACETATE 250 MG PO TABS: 1000.0000 mg | ORAL_TABLET | Freq: Every day | ORAL | 0 refills | Status: DC

## 2022-06-29 NOTE — Telephone Encounter (Signed)
Received faxed refill request for Zytiga. Order placed with cosign request from Dr. Alen Blew.

## 2022-07-04 NOTE — Progress Notes (Signed)
  Radiation Oncology         [phone] ________________________________  Name: Mark Figueroa MRN: [id number]  Date: 07/06/2022  DOB: 1961-02-12  SIMULATION AND TREATMENT PLANNING NOTE    ICD-10-CM   1. 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  NARRATIVE:  The patient was brought to the [facility].  Identity was confirmed.  All relevant records and images related to the planned course of therapy were reviewed.  The patient freely provided informed written consent to proceed with treatment after reviewing the details related to the planned course of therapy. The consent form was witnessed and verified by the simulation staff.  Then, the patient was set-up in a stable reproducible supine position for radiation therapy.  A vacuum lock pillow device was custom fabricated to position his legs in a reproducible immobilized position.  Then, I performed a urethrogram under sterile conditions to identify the prostatic bed.  CT images were obtained.  Surface markings were placed.  The CT images were loaded into the planning software.  Then the prostate bed target, pelvic lymph node target and avoidance structures including the rectum, bladder, bowel and hips were contoured.  Treatment planning then occurred.  The radiation prescription was entered and confirmed.  A total of one complex treatment devices were fabricated. I have requested : Intensity Modulated Radiotherapy (IMRT) is medically necessary for this case for the following reason:  Rectal sparing.Marland Kitchen  PLAN:  The patient will receive 45 Gy in 25 fractions of 1.8 Gy, followed by a boost to the prostate bed to a total dose of 68.4 Gy with 13 additional fractions of 1.8 Gy with simultaneous boost to 76.2 Gy in 13 fractions of 2.4 Gy to the  involved nodes.   ________________________________  Sheral Apley Tammi Klippel, M.D.

## 2022-07-06 ENCOUNTER — Ambulatory Visit
Admission: RE | Admit: 2022-07-06 | Discharge: 2022-07-06 | Disposition: A | Discharge status: Home / Self Care | Payer: BC Managed Care – PPO | Source: Ambulatory Visit | Attending: Radiation Oncology | Admitting: Radiation Oncology

## 2022-07-06 ENCOUNTER — Other Ambulatory Visit: Payer: Self-pay

## 2022-07-06 DIAGNOSIS — C61 Malignant neoplasm of prostate: Secondary | ICD-10-CM | POA: Insufficient documentation

## 2022-07-06 DIAGNOSIS — C775 Secondary and unspecified malignant neoplasm of intrapelvic lymph nodes: Secondary | ICD-10-CM | POA: Insufficient documentation

## 2022-07-06 DIAGNOSIS — Z510 Encounter for antineoplastic radiation therapy: Secondary | ICD-10-CM | POA: Diagnosis not present

## 2022-07-10 ENCOUNTER — Inpatient Hospital Stay (HOSPITAL_BASED_OUTPATIENT_CLINIC_OR_DEPARTMENT_OTHER): Payer: BC Managed Care – PPO | Admitting: Oncology

## 2022-07-10 ENCOUNTER — Inpatient Hospital Stay: Payer: BC Managed Care – PPO | Attending: Oncology

## 2022-07-10 ENCOUNTER — Inpatient Hospital Stay: Payer: BC Managed Care – PPO

## 2022-07-10 ENCOUNTER — Other Ambulatory Visit: Payer: Self-pay

## 2022-07-10 VITALS — BP 120/81 | HR 84 | Temp 97.9°F | Resp 17 | Ht 67.0 in | Wt 209.5 lb

## 2022-07-10 DIAGNOSIS — C61 Malignant neoplasm of prostate: Secondary | ICD-10-CM

## 2022-07-10 DIAGNOSIS — E876 Hypokalemia: Secondary | ICD-10-CM | POA: Insufficient documentation

## 2022-07-10 DIAGNOSIS — Z79899 Other long term (current) drug therapy: Secondary | ICD-10-CM | POA: Insufficient documentation

## 2022-07-10 DIAGNOSIS — Z510 Encounter for antineoplastic radiation therapy: Secondary | ICD-10-CM | POA: Diagnosis not present

## 2022-07-10 DIAGNOSIS — I10 Essential (primary) hypertension: Secondary | ICD-10-CM | POA: Insufficient documentation

## 2022-07-10 LAB — CMP (CANCER CENTER ONLY)
ALT: 33 U/L (ref 0–44)
AST: 29 U/L (ref 15–41)
Albumin: 4.4 g/dL (ref 3.5–5.0)
Alkaline Phosphatase: 56 U/L (ref 38–126)
Anion gap: 7 (ref 5–15)
BUN: 20 mg/dL (ref 8–23)
CO2: 29 mmol/L (ref 22–32)
Calcium: 9.8 mg/dL (ref 8.9–10.3)
Chloride: 103 mmol/L (ref 98–111)
Creatinine: 1 mg/dL (ref 0.61–1.24)
GFR, Estimated: >60 mL/min (ref >60)
Glucose, Bld: 96 mg/dL (ref 70–99)
Potassium: 3.8 mmol/L (ref 3.5–5.1)
Sodium: 139 mmol/L (ref 135–145)
Total Bilirubin: 0.8 mg/dL (ref 0.3–1.2)
Total Protein: 7.7 g/dL (ref 6.5–8.1)

## 2022-07-10 LAB — CBC WITH DIFFERENTIAL (CANCER CENTER ONLY)
Abs Immature Granulocytes: 0.01 10*3/uL (ref 0.00–0.07)
Basophils Absolute: 0 10*3/uL (ref 0.0–0.1)
Basophils Relative: 1 %
Eosinophils Absolute: 0.1 10*3/uL (ref 0.0–0.5)
Eosinophils Relative: 1 %
HCT: 42.9 % (ref 39.0–52.0)
Hemoglobin: 15.9 g/dL (ref 13.0–17.0)
Immature Granulocytes: 0 %
Lymphocytes Relative: 26 %
Lymphs Abs: 1.4 10*3/uL (ref 0.7–4.0)
MCH: 27.6 pg (ref 26.0–34.0)
MCHC: 37.1 g/dL — ABNORMAL HIGH (ref 30.0–36.0)
MCV: 74.4 fL — ABNORMAL LOW (ref 80.0–100.0)
Monocytes Absolute: 0.5 10*3/uL (ref 0.1–1.0)
Monocytes Relative: 8 %
Neutro Abs: 3.4 10*3/uL (ref 1.7–7.7)
Neutrophils Relative %: 64 %
Platelet Count: 276 10*3/uL (ref 150–400)
RBC: 5.77 MIL/uL (ref 4.22–5.81)
RDW: 14.8 % (ref 11.5–15.5)
WBC Count: 5.3 10*3/uL (ref 4.0–10.5)
nRBC: 0 % (ref 0.0–0.2)

## 2022-07-10 MED ORDER — DEGARELIX ACETATE 80 MG ~~LOC~~ SOLR
80.0000 mg | Freq: Once | SUBCUTANEOUS | Status: AC
  Administered 2022-07-10: 80 mg via SUBCUTANEOUS
  Filled 2022-07-10: qty 4

## 2022-07-10 MED ORDER — PREDNISONE 5 MG PO TABS: 5.0000 mg | ORAL_TABLET | Freq: Every day | ORAL | 1 refills | Status: DC

## 2022-07-10 NOTE — Progress Notes (Signed)
Ok to give firmagon 1 week early per Dr Alen Blew

## 2022-07-10 NOTE — Progress Notes (Signed)
Hematology and Oncology Follow Up Visit  Mark Figueroa [id number] [birth year] 61 y.o. 07/10/2022 11:44 AM Pcp, Mark Sauer, MD   Principle Diagnosis: 61 year old with with castration-sensitive advanced disease with lymphadenopathy diagnosed in April 2023.  Initially presented with localized disease in September 2022 with Gleason score of 9.   Prior Therapy:  He underwent robotic assisted laparoscopic radical prostatectomy and bilateral extensive lymph node dose dissection on November 22, 2021.  The final pathology showed prostate adenocarcinoma Gleason score 9 with extraprostatic extension and lymphovascular invasion.  Margins focally positive with a 0/8 total lymph nodes showed malignancy.  The final pathological staging was T3bN0.    His follow-up PSA in January 2023 was 1.18 and on April 16, 2022 was 28.  This was repeated on April 23, 2022 was 27.6.  Based on these findings he underwent PSMA pet imaging which showed soft tissue mass within the central low-attenuation along the right pelvic sidewall measuring 2.8 x 4.1 cm with SUV of 4.53 and a left pelvic sidewall lymph node measuring 1.6 x 1.5 cm with SUV of 2.84.  Current therapy:   Firmagon 240 mg started on June 18, 2022.  He will receive another injection today and transition to Eligard in the future.  Zytiga 1000 mg daily with prednisone started on June 13, 2022.  Interim History: Mr. Mark Figueroa returns today for a follow-up visit.  Since the last visit, he has started Zytiga and prednisone without any major complications.  He denies any nausea, vomiting or abdominal pain.  Denies any excessive fatigue or tiredness or weakness.  He denies any hospitalizations or illnesses.  He denies any worsening edema or hyperglycemia.    Medications: I have reviewed the patient's current medications.  Current Outpatient Medications  Medication Sig Dispense Refill   abiraterone acetate (ZYTIGA) 250 MG tablet Take 4 tablets (1,000 mg total) by  mouth daily. Take on an empty stomach 1 hour before or 2 hours after a meal 120 tablet 0   aspirin EC 81 MG tablet Take 81 mg by mouth daily. Swallow whole. (Patient not taking: Reported on 06/06/2022)     atorvastatin (LIPITOR) 20 MG tablet Take 20 mg by mouth daily.     hydrochlorothiazide (HYDRODIURIL) 25 MG tablet Take 25 mg by mouth daily.     losartan (COZAAR) 100 MG tablet Take 100 mg by mouth daily.     predniSONE (DELTASONE) 5 MG tablet Take 1 tablet (5 mg total) by mouth daily with breakfast. 90 tablet 1   tadalafil (CIALIS) 20 MG tablet Take 20 mg by mouth daily as needed for erectile dysfunction.     No current facility-administered medications for this visit.     Allergies: No Known Allergies   Physical Exam: Blood pressure 120/81, pulse 84, temperature 97.9 F (36.6 C), temperature source Temporal, resp. rate 17, height '5\' 7"'$  (1.702 m), weight 209 lb 8 oz (95 kg), SpO2 97 %. ECOG: 0    General appearance: Comfortable appearing without any discomfort Head: Normocephalic without any trauma Oropharynx: Mucous membranes are moist and pink without any thrush or ulcers. Eyes: Pupils are equal and round reactive to light. Lymph nodes: No cervical, supraclavicular, inguinal or axillary lymphadenopathy.   Heart:regular rate and rhythm.  S1 and S2 without leg edema. Lung: Clear without any rhonchi or wheezes.  No dullness to percussion. Abdomin: Soft, nontender, nondistended with good bowel sounds.  No hepatosplenomegaly. Musculoskeletal: No joint deformity or effusion.  Full range of motion noted. Neurological: No deficits noted on  motor, sensory and deep tendon reflex exam. Skin: No petechial rash or dryness.  Appeared moist.  .    Lab Results: Lab Results  Component Value Date   WBC 5.3 07/10/2022   HGB 15.9 07/10/2022   HCT 42.9 07/10/2022   MCV 74.4 (L) 07/10/2022   PLT 276 07/10/2022     Chemistry      Component Value Date/Time   NA 140 06/06/2022 1441   K  3.7 06/06/2022 1441   CL 104 06/06/2022 1441   CO2 26 06/06/2022 1441   BUN 22 (H) 06/06/2022 1441   CREATININE 0.93 06/06/2022 1441      Component Value Date/Time   CALCIUM 9.7 06/06/2022 1441   ALKPHOS 51 06/06/2022 1441   AST 23 06/06/2022 1441   ALT 23 06/06/2022 1441   BILITOT 0.8 06/06/2022 1441          Impression and Plan:   61 year old with:  1.  Prostate cancer diagnosed in September 2022.  He developed castration-sensitive advanced disease with a PSA of 27.6 and PSMA PET positive scan including right side pelvic wall mass as well as left lymphadenopathy. .   He continues to tolerate Zytiga reasonably well without any complaints.  Complications including hot flashes weight gain, hypertension among others were reviewed.  He is agreeable to continue at this time.    2.  Androgen deprivation therapy: He will receive Firmagon today and transition to Eligard with the next injection.  Complications including weight gain and hot flashes and sexual dysfunction.    3.  Local therapy considerations: He is under consideration to start radiation therapy locally given his oligometastatic disease.   4.  Hypertension: No issues with blood pressure noted at this time.   5.  Hypokalemia considerations: We will continue to monitor on Zytiga.  6.  Follow-up: In 4 weeks for Eligard injection which will be repeated in 4 months.  He will have MD follow-up in 2 months.   30  minutes were spent on this visit.  The time was dedicated to reviewing laboratory data, disease status update and outlining future plan of care discussion.     Zola Button, MD 7/11/202311:44 AM

## 2022-07-10 NOTE — Addendum Note (Signed)
Addended by: Wyatt Portela on: 07/10/2022 12:35 PM   Modules accepted: Orders

## 2022-07-11 ENCOUNTER — Telehealth: Payer: Self-pay | Admitting: *Deleted

## 2022-07-11 LAB — PROSTATE-SPECIFIC AG, SERUM (LABCORP): Prostate Specific Ag, Serum: 0.9 ng/mL (ref 0.0–4.0)

## 2022-07-11 NOTE — Telephone Encounter (Signed)
PC to patient, informed him his PSA is 0.9, he verbalizes understanding.

## 2022-07-11 NOTE — Telephone Encounter (Signed)
-----   Message from Wyatt Portela, MD sent at 07/11/2022  2:41 PM EDT ----- Please let him know his PSA is down

## 2022-07-19 ENCOUNTER — Other Ambulatory Visit: Payer: Self-pay

## 2022-07-19 ENCOUNTER — Ambulatory Visit
Admission: RE | Admit: 2022-07-19 | Discharge: 2022-07-19 | Disposition: A | Discharge status: Home / Self Care | Payer: BC Managed Care – PPO | Source: Ambulatory Visit | Attending: Radiation Oncology | Admitting: Radiation Oncology

## 2022-07-19 DIAGNOSIS — Z510 Encounter for antineoplastic radiation therapy: Secondary | ICD-10-CM | POA: Diagnosis not present

## 2022-07-19 LAB — RAD ONC ARIA SESSION SUMMARY
Course Elapsed Days: 0
Plan Fractions Treated to Date: 1
Plan Prescribed Dose Per Fraction: 1.8 Gy
Plan Total Fractions Prescribed: 25
Plan Total Prescribed Dose: 45 Gy
Reference Point Dosage Given to Date: 1.8 Gy
Reference Point Session Dosage Given: 1.8 Gy
Session Number: 1

## 2022-07-20 ENCOUNTER — Other Ambulatory Visit: Payer: Self-pay

## 2022-07-20 ENCOUNTER — Ambulatory Visit
Admission: RE | Admit: 2022-07-20 | Discharge: 2022-07-20 | Disposition: A | Discharge status: Home / Self Care | Payer: BC Managed Care – PPO | Source: Ambulatory Visit | Attending: Radiation Oncology | Admitting: Radiation Oncology

## 2022-07-20 DIAGNOSIS — Z510 Encounter for antineoplastic radiation therapy: Secondary | ICD-10-CM | POA: Diagnosis not present

## 2022-07-20 LAB — RAD ONC ARIA SESSION SUMMARY
Course Elapsed Days: 1
Plan Fractions Treated to Date: 2
Plan Prescribed Dose Per Fraction: 1.8 Gy
Plan Total Fractions Prescribed: 25
Plan Total Prescribed Dose: 45 Gy
Reference Point Dosage Given to Date: 3.6 Gy
Reference Point Session Dosage Given: 1.8 Gy
Session Number: 2

## 2022-07-23 ENCOUNTER — Other Ambulatory Visit: Payer: Self-pay

## 2022-07-23 ENCOUNTER — Ambulatory Visit
Admission: RE | Admit: 2022-07-23 | Discharge: 2022-07-23 | Disposition: A | Discharge status: Home / Self Care | Payer: BC Managed Care – PPO | Source: Ambulatory Visit | Attending: Radiation Oncology | Admitting: Radiation Oncology

## 2022-07-23 DIAGNOSIS — Z510 Encounter for antineoplastic radiation therapy: Secondary | ICD-10-CM | POA: Diagnosis not present

## 2022-07-23 LAB — RAD ONC ARIA SESSION SUMMARY
Course Elapsed Days: 4
Plan Fractions Treated to Date: 3
Plan Prescribed Dose Per Fraction: 1.8 Gy
Plan Total Fractions Prescribed: 25
Plan Total Prescribed Dose: 45 Gy
Reference Point Dosage Given to Date: 5.4 Gy
Reference Point Session Dosage Given: 1.8 Gy
Session Number: 3

## 2022-07-24 ENCOUNTER — Ambulatory Visit
Admission: RE | Admit: 2022-07-24 | Discharge: 2022-07-24 | Disposition: A | Discharge status: Home / Self Care | Payer: BC Managed Care – PPO | Source: Ambulatory Visit | Attending: Radiation Oncology | Admitting: Radiation Oncology

## 2022-07-24 ENCOUNTER — Other Ambulatory Visit: Payer: Self-pay

## 2022-07-24 ENCOUNTER — Encounter: Payer: BC Managed Care – PPO | Admitting: Genetic Counselor

## 2022-07-24 ENCOUNTER — Other Ambulatory Visit: Payer: BC Managed Care – PPO

## 2022-07-24 DIAGNOSIS — Z510 Encounter for antineoplastic radiation therapy: Secondary | ICD-10-CM | POA: Diagnosis not present

## 2022-07-24 LAB — RAD ONC ARIA SESSION SUMMARY
Course Elapsed Days: 5
Plan Fractions Treated to Date: 4
Plan Prescribed Dose Per Fraction: 1.8 Gy
Plan Total Fractions Prescribed: 25
Plan Total Prescribed Dose: 45 Gy
Reference Point Dosage Given to Date: 7.2 Gy
Reference Point Session Dosage Given: 1.8 Gy
Session Number: 4

## 2022-07-25 ENCOUNTER — Other Ambulatory Visit: Payer: Self-pay

## 2022-07-25 ENCOUNTER — Ambulatory Visit
Admission: RE | Admit: 2022-07-25 | Discharge: 2022-07-25 | Disposition: A | Discharge status: Home / Self Care | Payer: BC Managed Care – PPO | Source: Ambulatory Visit | Attending: Radiation Oncology | Admitting: Radiation Oncology

## 2022-07-25 DIAGNOSIS — Z510 Encounter for antineoplastic radiation therapy: Secondary | ICD-10-CM | POA: Diagnosis not present

## 2022-07-25 LAB — RAD ONC ARIA SESSION SUMMARY
Course Elapsed Days: 6
Plan Fractions Treated to Date: 5
Plan Prescribed Dose Per Fraction: 1.8 Gy
Plan Total Fractions Prescribed: 25
Plan Total Prescribed Dose: 45 Gy
Reference Point Dosage Given to Date: 9 Gy
Reference Point Session Dosage Given: 1.8 Gy
Session Number: 5

## 2022-07-26 ENCOUNTER — Other Ambulatory Visit: Payer: Self-pay

## 2022-07-26 ENCOUNTER — Ambulatory Visit
Admission: RE | Admit: 2022-07-26 | Discharge: 2022-07-26 | Disposition: A | Discharge status: Home / Self Care | Payer: BC Managed Care – PPO | Source: Ambulatory Visit | Attending: Radiation Oncology | Admitting: Radiation Oncology

## 2022-07-26 DIAGNOSIS — Z510 Encounter for antineoplastic radiation therapy: Secondary | ICD-10-CM | POA: Diagnosis not present

## 2022-07-26 LAB — RAD ONC ARIA SESSION SUMMARY
Course Elapsed Days: 7
Plan Fractions Treated to Date: 6
Plan Prescribed Dose Per Fraction: 1.8 Gy
Plan Total Fractions Prescribed: 25
Plan Total Prescribed Dose: 45 Gy
Reference Point Dosage Given to Date: 10.8 Gy
Reference Point Session Dosage Given: 1.8 Gy
Session Number: 6

## 2022-07-27 ENCOUNTER — Ambulatory Visit
Admission: RE | Admit: 2022-07-27 | Discharge: 2022-07-27 | Disposition: A | Discharge status: Home / Self Care | Payer: BC Managed Care – PPO | Source: Ambulatory Visit | Attending: Radiation Oncology | Admitting: Radiation Oncology

## 2022-07-27 ENCOUNTER — Other Ambulatory Visit: Payer: Self-pay

## 2022-07-27 DIAGNOSIS — Z510 Encounter for antineoplastic radiation therapy: Secondary | ICD-10-CM | POA: Diagnosis not present

## 2022-07-27 LAB — RAD ONC ARIA SESSION SUMMARY
Course Elapsed Days: 8
Plan Fractions Treated to Date: 7
Plan Prescribed Dose Per Fraction: 1.8 Gy
Plan Total Fractions Prescribed: 25
Plan Total Prescribed Dose: 45 Gy
Reference Point Dosage Given to Date: 12.6 Gy
Reference Point Session Dosage Given: 1.8 Gy
Session Number: 7

## 2022-07-30 ENCOUNTER — Ambulatory Visit: Payer: BC Managed Care – PPO

## 2022-07-31 ENCOUNTER — Other Ambulatory Visit: Payer: Self-pay

## 2022-07-31 ENCOUNTER — Ambulatory Visit
Admission: RE | Admit: 2022-07-31 | Discharge: 2022-07-31 | Disposition: A | Discharge status: Home / Self Care | Payer: BC Managed Care – PPO | Source: Ambulatory Visit | Attending: Radiation Oncology | Admitting: Radiation Oncology

## 2022-07-31 DIAGNOSIS — Z510 Encounter for antineoplastic radiation therapy: Secondary | ICD-10-CM | POA: Diagnosis present

## 2022-07-31 DIAGNOSIS — C61 Malignant neoplasm of prostate: Secondary | ICD-10-CM | POA: Diagnosis present

## 2022-07-31 DIAGNOSIS — C775 Secondary and unspecified malignant neoplasm of intrapelvic lymph nodes: Secondary | ICD-10-CM | POA: Insufficient documentation

## 2022-07-31 LAB — RAD ONC ARIA SESSION SUMMARY
Course Elapsed Days: 12
Plan Fractions Treated to Date: 8
Plan Prescribed Dose Per Fraction: 1.8 Gy
Plan Total Fractions Prescribed: 25
Plan Total Prescribed Dose: 45 Gy
Reference Point Dosage Given to Date: 14.4 Gy
Reference Point Session Dosage Given: 1.8 Gy
Session Number: 8

## 2022-08-01 ENCOUNTER — Ambulatory Visit
Admission: RE | Admit: 2022-08-01 | Discharge: 2022-08-01 | Disposition: A | Discharge status: Home / Self Care | Payer: BC Managed Care – PPO | Source: Ambulatory Visit | Attending: Radiation Oncology | Admitting: Radiation Oncology

## 2022-08-01 ENCOUNTER — Other Ambulatory Visit: Payer: Self-pay

## 2022-08-01 DIAGNOSIS — Z510 Encounter for antineoplastic radiation therapy: Secondary | ICD-10-CM | POA: Diagnosis not present

## 2022-08-01 LAB — RAD ONC ARIA SESSION SUMMARY
Course Elapsed Days: 13
Plan Fractions Treated to Date: 9
Plan Prescribed Dose Per Fraction: 1.8 Gy
Plan Total Fractions Prescribed: 25
Plan Total Prescribed Dose: 45 Gy
Reference Point Dosage Given to Date: 16.2 Gy
Reference Point Session Dosage Given: 1.8 Gy
Session Number: 9

## 2022-08-02 ENCOUNTER — Other Ambulatory Visit: Payer: Self-pay

## 2022-08-02 ENCOUNTER — Ambulatory Visit
Admission: RE | Admit: 2022-08-02 | Discharge: 2022-08-02 | Disposition: A | Discharge status: Home / Self Care | Payer: BC Managed Care – PPO | Source: Ambulatory Visit | Attending: Radiation Oncology | Admitting: Radiation Oncology

## 2022-08-02 DIAGNOSIS — Z510 Encounter for antineoplastic radiation therapy: Secondary | ICD-10-CM | POA: Diagnosis not present

## 2022-08-02 LAB — RAD ONC ARIA SESSION SUMMARY
Course Elapsed Days: 14
Plan Fractions Treated to Date: 10
Plan Prescribed Dose Per Fraction: 1.8 Gy
Plan Total Fractions Prescribed: 25
Plan Total Prescribed Dose: 45 Gy
Reference Point Dosage Given to Date: 18 Gy
Reference Point Session Dosage Given: 1.8 Gy
Session Number: 10

## 2022-08-03 ENCOUNTER — Ambulatory Visit
Admission: RE | Admit: 2022-08-03 | Discharge: 2022-08-03 | Disposition: A | Discharge status: Home / Self Care | Payer: BC Managed Care – PPO | Source: Ambulatory Visit | Attending: Radiation Oncology | Admitting: Radiation Oncology

## 2022-08-03 ENCOUNTER — Other Ambulatory Visit: Payer: Self-pay

## 2022-08-03 DIAGNOSIS — Z510 Encounter for antineoplastic radiation therapy: Secondary | ICD-10-CM | POA: Diagnosis not present

## 2022-08-03 LAB — RAD ONC ARIA SESSION SUMMARY
Course Elapsed Days: 15
Plan Fractions Treated to Date: 11
Plan Prescribed Dose Per Fraction: 1.8 Gy
Plan Total Fractions Prescribed: 25
Plan Total Prescribed Dose: 45 Gy
Reference Point Dosage Given to Date: 19.8 Gy
Reference Point Session Dosage Given: 1.8 Gy
Session Number: 11

## 2022-08-06 ENCOUNTER — Other Ambulatory Visit: Payer: Self-pay

## 2022-08-06 ENCOUNTER — Ambulatory Visit
Admission: RE | Admit: 2022-08-06 | Discharge: 2022-08-06 | Disposition: A | Discharge status: Home / Self Care | Payer: BC Managed Care – PPO | Source: Ambulatory Visit | Attending: Radiation Oncology | Admitting: Radiation Oncology

## 2022-08-06 ENCOUNTER — Other Ambulatory Visit: Payer: Self-pay | Admitting: Oncology

## 2022-08-06 DIAGNOSIS — C61 Malignant neoplasm of prostate: Secondary | ICD-10-CM

## 2022-08-06 DIAGNOSIS — Z510 Encounter for antineoplastic radiation therapy: Secondary | ICD-10-CM | POA: Diagnosis not present

## 2022-08-06 LAB — RAD ONC ARIA SESSION SUMMARY
Course Elapsed Days: 18
Plan Fractions Treated to Date: 12
Plan Prescribed Dose Per Fraction: 1.8 Gy
Plan Total Fractions Prescribed: 25
Plan Total Prescribed Dose: 45 Gy
Reference Point Dosage Given to Date: 21.6 Gy
Reference Point Session Dosage Given: 1.8 Gy
Session Number: 12

## 2022-08-07 ENCOUNTER — Other Ambulatory Visit: Payer: Self-pay

## 2022-08-07 ENCOUNTER — Ambulatory Visit
Admission: RE | Admit: 2022-08-07 | Discharge: 2022-08-07 | Disposition: A | Discharge status: Home / Self Care | Payer: BC Managed Care – PPO | Source: Ambulatory Visit | Attending: Radiation Oncology | Admitting: Radiation Oncology

## 2022-08-07 DIAGNOSIS — Z510 Encounter for antineoplastic radiation therapy: Secondary | ICD-10-CM | POA: Diagnosis not present

## 2022-08-07 LAB — RAD ONC ARIA SESSION SUMMARY
Course Elapsed Days: 19
Plan Fractions Treated to Date: 13
Plan Prescribed Dose Per Fraction: 1.8 Gy
Plan Total Fractions Prescribed: 25
Plan Total Prescribed Dose: 45 Gy
Reference Point Dosage Given to Date: 23.4 Gy
Reference Point Session Dosage Given: 1.8 Gy
Session Number: 13

## 2022-08-08 ENCOUNTER — Other Ambulatory Visit: Payer: Self-pay

## 2022-08-08 ENCOUNTER — Ambulatory Visit
Admission: RE | Admit: 2022-08-08 | Discharge: 2022-08-08 | Disposition: A | Discharge status: Home / Self Care | Payer: BC Managed Care – PPO | Source: Ambulatory Visit | Attending: Radiation Oncology | Admitting: Radiation Oncology

## 2022-08-08 ENCOUNTER — Inpatient Hospital Stay: Payer: BC Managed Care – PPO | Attending: Oncology

## 2022-08-08 ENCOUNTER — Other Ambulatory Visit: Payer: Self-pay | Admitting: Oncology

## 2022-08-08 VITALS — BP 114/70 | HR 68 | Temp 99.0°F | Resp 16

## 2022-08-08 DIAGNOSIS — Z79899 Other long term (current) drug therapy: Secondary | ICD-10-CM | POA: Diagnosis not present

## 2022-08-08 DIAGNOSIS — E876 Hypokalemia: Secondary | ICD-10-CM | POA: Diagnosis not present

## 2022-08-08 DIAGNOSIS — I10 Essential (primary) hypertension: Secondary | ICD-10-CM | POA: Diagnosis not present

## 2022-08-08 DIAGNOSIS — C61 Malignant neoplasm of prostate: Secondary | ICD-10-CM | POA: Insufficient documentation

## 2022-08-08 LAB — RAD ONC ARIA SESSION SUMMARY
Course Elapsed Days: 20
Plan Fractions Treated to Date: 14
Plan Prescribed Dose Per Fraction: 1.8 Gy
Plan Total Fractions Prescribed: 25
Plan Total Prescribed Dose: 45 Gy
Reference Point Dosage Given to Date: 25.2 Gy
Reference Point Session Dosage Given: 1.8 Gy
Session Number: 14

## 2022-08-08 MED ORDER — LEUPROLIDE ACETATE (4 MONTH) 30 MG ~~LOC~~ KIT
30.0000 mg | PACK | Freq: Once | SUBCUTANEOUS | Status: AC
  Administered 2022-08-08: 30 mg via SUBCUTANEOUS
  Filled 2022-08-08: qty 30

## 2022-08-08 NOTE — Patient Instructions (Signed)
Leuprolide Suspension for Injection (Prostate Cancer) What is this medication? LEUPROLIDE (loo PROE lide) reduces the symptoms of prostate cancer. It works by decreasing levels of the hormone testosterone in the body. This prevents prostate cancer cells from spreading or growing. This medicine may be used for other purposes; ask your health care provider or pharmacist if you have questions. COMMON BRAND NAME(S): Eligard, Fensolvi, Lupron Depot, Lupron Depot-Ped, Lutrate Depot, Viadur What should I tell my care team before I take this medication? They need to know if you have any of these conditions: Diabetes Heart disease Heart failure High or low levels of electrolytes, such as magnesium, potassium, or sodium in your blood Irregular heartbeat or rhythm Seizures An unusual or allergic reaction to leuprolide, other medications, foods, dyes, or preservatives Pregnant or trying to get pregnant Breast-feeding How should I use this medication? This medication is injected under the skin or into a muscle. It is given by your care team in a hospital or clinic setting. Talk to your care team about the use of this medication in children. Special care may be needed. Overdosage: If you think you have taken too much of this medicine contact a poison control center or emergency room at once. NOTE: This medicine is only for you. Do not share this medicine with others. What if I miss a dose? Keep appointments for follow-up doses. It is important not to miss your dose. Call your care team if you are unable to keep an appointment. What may interact with this medication? Do not take this medication with any of the following: Cisapride Dronedarone Ketoconazole Levoketoconazole Pimozide Thioridazine This medication may also interact with the following: Other medications that cause heart rhythm changes This list may not describe all possible interactions. Give your health care provider a list of all the  medicines, herbs, non-prescription drugs, or dietary supplements you use. Also tell them if you smoke, drink alcohol, or use illegal drugs. Some items may interact with your medicine. What should I watch for while using this medication? Visit your care team for regular checks on your progress. Tell your care team if your symptoms do not start to get better or if they get worse. This medication may increase blood sugar. The risk may be higher in patients who already have diabetes. Ask your care team what you can do to lower the risk of diabetes while taking this medication. This medication may cause infertility. Talk to your care team if you are concerned about your fertility. Heart attacks and strokes have been reported with the use of this medication. Get emergency help if you develop signs or symptoms of a heart attack or stroke. Talk to your care team about the risks and benefits of this medication. What side effects may I notice from receiving this medication? Side effects that you should report to your care team as soon as possible: Allergic reactions--skin rash, itching, hives, swelling of the face, lips, tongue, or throat Heart attack--pain or tightness in the chest, shoulders, arms, or jaw, nausea, shortness of breath, cold or clammy skin, feeling faint or lightheaded Heart rhythm changes--fast or irregular heartbeat, dizziness, feeling faint or lightheaded, chest pain, trouble breathing High blood sugar (hyperglycemia)--increased thirst or amount of urine, unusual weakness or fatigue, blurry vision Mood swings, irritability, hostility Seizures Stroke--sudden numbness or weakness of the face, arm, or leg, trouble speaking, confusion, trouble walking, loss of balance or coordination, dizziness, severe headache, change in vision Thoughts of suicide or self-harm, worsening mood, feelings of depression  Side effects that usually do not require medical attention (report to your care team if they  continue or are bothersome): Bone pain Change in sex drive or performance General discomfort and fatigue Hot flashes Muscle pain Pain, redness, or irritation at injection site Swelling of the ankles, hands, or feet This list may not describe all possible side effects. Call your doctor for medical advice about side effects. You may report side effects to FDA at 1-800-FDA-1088. Where should I keep my medication? This medication is given in a hospital or clinic. It will not be stored at home. NOTE: This sheet is a summary. It may not cover all possible information. If you have questions about this medicine, talk to your doctor, pharmacist, or health care provider.  2023 Elsevier/Gold Standard (2022-02-26 00:00:00)

## 2022-08-09 ENCOUNTER — Ambulatory Visit
Admission: RE | Admit: 2022-08-09 | Discharge: 2022-08-09 | Disposition: A | Discharge status: Home / Self Care | Payer: BC Managed Care – PPO | Source: Ambulatory Visit | Attending: Radiation Oncology | Admitting: Radiation Oncology

## 2022-08-09 ENCOUNTER — Other Ambulatory Visit: Payer: Self-pay

## 2022-08-09 DIAGNOSIS — Z510 Encounter for antineoplastic radiation therapy: Secondary | ICD-10-CM | POA: Diagnosis not present

## 2022-08-09 LAB — RAD ONC ARIA SESSION SUMMARY
Course Elapsed Days: 21
Plan Fractions Treated to Date: 15
Plan Prescribed Dose Per Fraction: 1.8 Gy
Plan Total Fractions Prescribed: 25
Plan Total Prescribed Dose: 45 Gy
Reference Point Dosage Given to Date: 27 Gy
Reference Point Session Dosage Given: 1.8 Gy
Session Number: 15

## 2022-08-10 ENCOUNTER — Ambulatory Visit
Admission: RE | Admit: 2022-08-10 | Discharge: 2022-08-10 | Disposition: A | Discharge status: Home / Self Care | Payer: BC Managed Care – PPO | Source: Ambulatory Visit | Attending: Radiation Oncology | Admitting: Radiation Oncology

## 2022-08-10 ENCOUNTER — Other Ambulatory Visit: Payer: Self-pay

## 2022-08-10 DIAGNOSIS — Z510 Encounter for antineoplastic radiation therapy: Secondary | ICD-10-CM | POA: Diagnosis not present

## 2022-08-10 LAB — RAD ONC ARIA SESSION SUMMARY
Course Elapsed Days: 22
Plan Fractions Treated to Date: 16
Plan Prescribed Dose Per Fraction: 1.8 Gy
Plan Total Fractions Prescribed: 25
Plan Total Prescribed Dose: 45 Gy
Reference Point Dosage Given to Date: 28.8 Gy
Reference Point Session Dosage Given: 1.8 Gy
Session Number: 16

## 2022-08-13 ENCOUNTER — Other Ambulatory Visit: Payer: Self-pay

## 2022-08-13 ENCOUNTER — Ambulatory Visit
Admission: RE | Admit: 2022-08-13 | Discharge: 2022-08-13 | Disposition: A | Discharge status: Home / Self Care | Payer: BC Managed Care – PPO | Source: Ambulatory Visit | Attending: Radiation Oncology | Admitting: Radiation Oncology

## 2022-08-13 DIAGNOSIS — Z510 Encounter for antineoplastic radiation therapy: Secondary | ICD-10-CM | POA: Diagnosis not present

## 2022-08-13 LAB — RAD ONC ARIA SESSION SUMMARY
Course Elapsed Days: 25
Plan Fractions Treated to Date: 17
Plan Prescribed Dose Per Fraction: 1.8 Gy
Plan Total Fractions Prescribed: 25
Plan Total Prescribed Dose: 45 Gy
Reference Point Dosage Given to Date: 30.6 Gy
Reference Point Session Dosage Given: 1.8 Gy
Session Number: 17

## 2022-08-14 ENCOUNTER — Other Ambulatory Visit (HOSPITAL_COMMUNITY): Payer: Self-pay

## 2022-08-14 ENCOUNTER — Other Ambulatory Visit: Payer: Self-pay

## 2022-08-14 ENCOUNTER — Telehealth: Payer: Self-pay

## 2022-08-14 ENCOUNTER — Telehealth: Payer: Self-pay | Admitting: *Deleted

## 2022-08-14 ENCOUNTER — Ambulatory Visit
Admission: RE | Admit: 2022-08-14 | Discharge: 2022-08-14 | Disposition: A | Discharge status: Home / Self Care | Payer: BC Managed Care – PPO | Source: Ambulatory Visit | Attending: Radiation Oncology | Admitting: Radiation Oncology

## 2022-08-14 ENCOUNTER — Other Ambulatory Visit: Payer: Self-pay | Admitting: *Deleted

## 2022-08-14 DIAGNOSIS — C61 Malignant neoplasm of prostate: Secondary | ICD-10-CM

## 2022-08-14 DIAGNOSIS — Z510 Encounter for antineoplastic radiation therapy: Secondary | ICD-10-CM | POA: Diagnosis not present

## 2022-08-14 LAB — RAD ONC ARIA SESSION SUMMARY
Course Elapsed Days: 26
Plan Fractions Treated to Date: 18
Plan Prescribed Dose Per Fraction: 1.8 Gy
Plan Total Fractions Prescribed: 25
Plan Total Prescribed Dose: 45 Gy
Reference Point Dosage Given to Date: 32.4 Gy
Reference Point Session Dosage Given: 1.8 Gy
Session Number: 18

## 2022-08-14 MED ORDER — ABIRATERONE ACETATE 250 MG PO TABS: 1000.0000 mg | ORAL_TABLET | Freq: Every day | ORAL | 0 refills | Status: DC

## 2022-08-14 NOTE — Telephone Encounter (Signed)
Oral Chemotherapy Pharmacist Encounter  Patient must now fill medication, Zytiga, at [facility] per insurance.   Phone number: [phone]   Drema Halon, PharmD Hematology/Oncology Clinical Pharmacist [facility] [phone]

## 2022-08-14 NOTE — Telephone Encounter (Signed)
Received call from [facility], due to patient's insurance Zytiga rx must be sent to [facility].  New rx forwarded to [facility], [place], [place].

## 2022-08-15 ENCOUNTER — Ambulatory Visit: Payer: BC Managed Care – PPO

## 2022-08-15 ENCOUNTER — Ambulatory Visit
Admission: RE | Admit: 2022-08-15 | Discharge: 2022-08-15 | Disposition: A | Discharge status: Home / Self Care | Payer: BC Managed Care – PPO | Source: Ambulatory Visit | Attending: Radiation Oncology | Admitting: Radiation Oncology

## 2022-08-15 ENCOUNTER — Other Ambulatory Visit: Payer: Self-pay

## 2022-08-15 DIAGNOSIS — Z510 Encounter for antineoplastic radiation therapy: Secondary | ICD-10-CM | POA: Diagnosis not present

## 2022-08-15 LAB — RAD ONC ARIA SESSION SUMMARY
Course Elapsed Days: 27
Plan Fractions Treated to Date: 19
Plan Prescribed Dose Per Fraction: 1.8 Gy
Plan Total Fractions Prescribed: 25
Plan Total Prescribed Dose: 45 Gy
Reference Point Dosage Given to Date: 34.2 Gy
Reference Point Session Dosage Given: 1.8 Gy
Session Number: 19

## 2022-08-16 ENCOUNTER — Ambulatory Visit: Payer: BC Managed Care – PPO

## 2022-08-16 DIAGNOSIS — Z510 Encounter for antineoplastic radiation therapy: Secondary | ICD-10-CM | POA: Diagnosis not present

## 2022-08-17 ENCOUNTER — Other Ambulatory Visit: Payer: Self-pay

## 2022-08-17 ENCOUNTER — Ambulatory Visit
Admission: RE | Admit: 2022-08-17 | Discharge: 2022-08-17 | Disposition: A | Discharge status: Home / Self Care | Payer: BC Managed Care – PPO | Source: Ambulatory Visit | Attending: Radiation Oncology | Admitting: Radiation Oncology

## 2022-08-17 DIAGNOSIS — Z510 Encounter for antineoplastic radiation therapy: Secondary | ICD-10-CM | POA: Diagnosis not present

## 2022-08-17 LAB — RAD ONC ARIA SESSION SUMMARY
Course Elapsed Days: 29
Plan Fractions Treated to Date: 20
Plan Prescribed Dose Per Fraction: 1.8 Gy
Plan Total Fractions Prescribed: 25
Plan Total Prescribed Dose: 45 Gy
Reference Point Dosage Given to Date: 36 Gy
Reference Point Session Dosage Given: 1.8 Gy
Session Number: 20

## 2022-08-20 ENCOUNTER — Other Ambulatory Visit: Payer: Self-pay

## 2022-08-20 ENCOUNTER — Ambulatory Visit
Admission: RE | Admit: 2022-08-20 | Discharge: 2022-08-20 | Disposition: A | Discharge status: Home / Self Care | Payer: BC Managed Care – PPO | Source: Ambulatory Visit | Attending: Radiation Oncology | Admitting: Radiation Oncology

## 2022-08-20 DIAGNOSIS — Z510 Encounter for antineoplastic radiation therapy: Secondary | ICD-10-CM | POA: Diagnosis not present

## 2022-08-20 LAB — RAD ONC ARIA SESSION SUMMARY
Course Elapsed Days: 32
Plan Fractions Treated to Date: 21
Plan Prescribed Dose Per Fraction: 1.8 Gy
Plan Total Fractions Prescribed: 25
Plan Total Prescribed Dose: 45 Gy
Reference Point Dosage Given to Date: 37.8 Gy
Reference Point Session Dosage Given: 1.8 Gy
Session Number: 21

## 2022-08-21 ENCOUNTER — Other Ambulatory Visit: Payer: Self-pay

## 2022-08-21 ENCOUNTER — Other Ambulatory Visit: Payer: Self-pay | Admitting: Oncology

## 2022-08-21 ENCOUNTER — Ambulatory Visit
Admission: RE | Admit: 2022-08-21 | Discharge: 2022-08-21 | Disposition: A | Discharge status: Home / Self Care | Payer: BC Managed Care – PPO | Source: Ambulatory Visit | Attending: Radiation Oncology | Admitting: Radiation Oncology

## 2022-08-21 DIAGNOSIS — Z510 Encounter for antineoplastic radiation therapy: Secondary | ICD-10-CM | POA: Diagnosis not present

## 2022-08-21 DIAGNOSIS — C61 Malignant neoplasm of prostate: Secondary | ICD-10-CM

## 2022-08-21 LAB — RAD ONC ARIA SESSION SUMMARY
Course Elapsed Days: 33
Plan Fractions Treated to Date: 22
Plan Prescribed Dose Per Fraction: 1.8 Gy
Plan Total Fractions Prescribed: 25
Plan Total Prescribed Dose: 45 Gy
Reference Point Dosage Given to Date: 39.6 Gy
Reference Point Session Dosage Given: 1.8 Gy
Session Number: 22

## 2022-08-22 ENCOUNTER — Ambulatory Visit: Payer: BC Managed Care – PPO

## 2022-08-22 ENCOUNTER — Other Ambulatory Visit: Payer: Self-pay

## 2022-08-22 DIAGNOSIS — Z510 Encounter for antineoplastic radiation therapy: Secondary | ICD-10-CM | POA: Diagnosis not present

## 2022-08-22 LAB — RAD ONC ARIA SESSION SUMMARY
Course Elapsed Days: 34
Plan Fractions Treated to Date: 23
Plan Prescribed Dose Per Fraction: 1.8 Gy
Plan Total Fractions Prescribed: 25
Plan Total Prescribed Dose: 45 Gy
Reference Point Dosage Given to Date: 41.4 Gy
Reference Point Session Dosage Given: 1.8 Gy
Session Number: 23

## 2022-08-23 ENCOUNTER — Other Ambulatory Visit: Payer: Self-pay

## 2022-08-23 ENCOUNTER — Ambulatory Visit: Payer: BC Managed Care – PPO

## 2022-08-23 DIAGNOSIS — Z510 Encounter for antineoplastic radiation therapy: Secondary | ICD-10-CM | POA: Diagnosis not present

## 2022-08-23 LAB — RAD ONC ARIA SESSION SUMMARY
Course Elapsed Days: 35
Plan Fractions Treated to Date: 24
Plan Prescribed Dose Per Fraction: 1.8 Gy
Plan Total Fractions Prescribed: 25
Plan Total Prescribed Dose: 45 Gy
Reference Point Dosage Given to Date: 43.2 Gy
Reference Point Session Dosage Given: 1.8 Gy
Session Number: 24

## 2022-08-24 ENCOUNTER — Ambulatory Visit
Admission: RE | Admit: 2022-08-24 | Discharge: 2022-08-24 | Disposition: A | Discharge status: Home / Self Care | Payer: BC Managed Care – PPO | Source: Ambulatory Visit | Attending: Radiation Oncology | Admitting: Radiation Oncology

## 2022-08-24 ENCOUNTER — Other Ambulatory Visit: Payer: Self-pay

## 2022-08-24 ENCOUNTER — Ambulatory Visit: Payer: BC Managed Care – PPO

## 2022-08-24 DIAGNOSIS — Z510 Encounter for antineoplastic radiation therapy: Secondary | ICD-10-CM | POA: Diagnosis not present

## 2022-08-24 LAB — RAD ONC ARIA SESSION SUMMARY
Course Elapsed Days: 36
Plan Fractions Treated to Date: 25
Plan Prescribed Dose Per Fraction: 1.8 Gy
Plan Total Fractions Prescribed: 25
Plan Total Prescribed Dose: 45 Gy
Reference Point Dosage Given to Date: 45 Gy
Reference Point Session Dosage Given: 1.8 Gy
Session Number: 25

## 2022-08-27 ENCOUNTER — Ambulatory Visit
Admission: RE | Admit: 2022-08-27 | Discharge: 2022-08-27 | Disposition: A | Discharge status: Home / Self Care | Payer: BC Managed Care – PPO | Source: Ambulatory Visit | Attending: Radiation Oncology | Admitting: Radiation Oncology

## 2022-08-27 ENCOUNTER — Other Ambulatory Visit: Payer: Self-pay

## 2022-08-27 DIAGNOSIS — Z510 Encounter for antineoplastic radiation therapy: Secondary | ICD-10-CM | POA: Diagnosis not present

## 2022-08-27 LAB — RAD ONC ARIA SESSION SUMMARY
Course Elapsed Days: 39
Plan Fractions Treated to Date: 1
Plan Prescribed Dose Per Fraction: 2.4 Gy
Plan Total Fractions Prescribed: 13
Plan Total Prescribed Dose: 31.2 Gy
Reference Point Dosage Given to Date: 47.4 Gy
Reference Point Session Dosage Given: 2.4 Gy
Session Number: 26

## 2022-08-28 ENCOUNTER — Ambulatory Visit
Admission: RE | Admit: 2022-08-28 | Discharge: 2022-08-28 | Disposition: A | Discharge status: Home / Self Care | Payer: BC Managed Care – PPO | Source: Ambulatory Visit | Attending: Radiation Oncology | Admitting: Radiation Oncology

## 2022-08-28 ENCOUNTER — Other Ambulatory Visit: Payer: Self-pay

## 2022-08-28 DIAGNOSIS — Z510 Encounter for antineoplastic radiation therapy: Secondary | ICD-10-CM | POA: Diagnosis not present

## 2022-08-28 LAB — RAD ONC ARIA SESSION SUMMARY
Course Elapsed Days: 40
Plan Fractions Treated to Date: 2
Plan Prescribed Dose Per Fraction: 2.4 Gy
Plan Total Fractions Prescribed: 13
Plan Total Prescribed Dose: 31.2 Gy
Reference Point Dosage Given to Date: 49.8 Gy
Reference Point Session Dosage Given: 2.4 Gy
Session Number: 27

## 2022-08-29 ENCOUNTER — Other Ambulatory Visit: Payer: Self-pay

## 2022-08-29 ENCOUNTER — Ambulatory Visit
Admission: RE | Admit: 2022-08-29 | Discharge: 2022-08-29 | Disposition: A | Discharge status: Home / Self Care | Payer: BC Managed Care – PPO | Source: Ambulatory Visit | Attending: Radiation Oncology | Admitting: Radiation Oncology

## 2022-08-29 DIAGNOSIS — Z510 Encounter for antineoplastic radiation therapy: Secondary | ICD-10-CM | POA: Diagnosis not present

## 2022-08-29 LAB — RAD ONC ARIA SESSION SUMMARY
Course Elapsed Days: 41
Plan Fractions Treated to Date: 3
Plan Prescribed Dose Per Fraction: 2.4 Gy
Plan Total Fractions Prescribed: 13
Plan Total Prescribed Dose: 31.2 Gy
Reference Point Dosage Given to Date: 52.2 Gy
Reference Point Session Dosage Given: 2.4 Gy
Session Number: 28

## 2022-08-30 ENCOUNTER — Ambulatory Visit
Admission: RE | Admit: 2022-08-30 | Discharge: 2022-08-30 | Disposition: A | Discharge status: Home / Self Care | Payer: BC Managed Care – PPO | Source: Ambulatory Visit | Attending: Radiation Oncology | Admitting: Radiation Oncology

## 2022-08-30 ENCOUNTER — Other Ambulatory Visit: Payer: Self-pay

## 2022-08-30 DIAGNOSIS — Z510 Encounter for antineoplastic radiation therapy: Secondary | ICD-10-CM | POA: Diagnosis not present

## 2022-08-30 LAB — RAD ONC ARIA SESSION SUMMARY
Course Elapsed Days: 42
Plan Fractions Treated to Date: 4
Plan Prescribed Dose Per Fraction: 2.4 Gy
Plan Total Fractions Prescribed: 13
Plan Total Prescribed Dose: 31.2 Gy
Reference Point Dosage Given to Date: 54.6 Gy
Reference Point Session Dosage Given: 2.4 Gy
Session Number: 29

## 2022-08-31 ENCOUNTER — Ambulatory Visit
Admission: RE | Admit: 2022-08-31 | Discharge: 2022-08-31 | Disposition: A | Discharge status: Home / Self Care | Payer: BC Managed Care – PPO | Source: Ambulatory Visit | Attending: Radiation Oncology | Admitting: Radiation Oncology

## 2022-08-31 ENCOUNTER — Other Ambulatory Visit: Payer: Self-pay

## 2022-08-31 DIAGNOSIS — Z510 Encounter for antineoplastic radiation therapy: Secondary | ICD-10-CM | POA: Diagnosis present

## 2022-08-31 DIAGNOSIS — C61 Malignant neoplasm of prostate: Secondary | ICD-10-CM | POA: Diagnosis present

## 2022-08-31 DIAGNOSIS — C775 Secondary and unspecified malignant neoplasm of intrapelvic lymph nodes: Secondary | ICD-10-CM | POA: Insufficient documentation

## 2022-08-31 LAB — RAD ONC ARIA SESSION SUMMARY
Course Elapsed Days: 43
Plan Fractions Treated to Date: 5
Plan Prescribed Dose Per Fraction: 2.4 Gy
Plan Total Fractions Prescribed: 13
Plan Total Prescribed Dose: 31.2 Gy
Reference Point Dosage Given to Date: 57 Gy
Reference Point Session Dosage Given: 2.4 Gy
Session Number: 30

## 2022-09-04 ENCOUNTER — Other Ambulatory Visit: Payer: Self-pay

## 2022-09-04 ENCOUNTER — Ambulatory Visit
Admission: RE | Admit: 2022-09-04 | Discharge: 2022-09-04 | Disposition: A | Discharge status: Home / Self Care | Payer: BC Managed Care – PPO | Source: Ambulatory Visit | Attending: Radiation Oncology | Admitting: Radiation Oncology

## 2022-09-04 DIAGNOSIS — Z510 Encounter for antineoplastic radiation therapy: Secondary | ICD-10-CM | POA: Diagnosis not present

## 2022-09-04 LAB — RAD ONC ARIA SESSION SUMMARY
Course Elapsed Days: 47
Plan Fractions Treated to Date: 6
Plan Prescribed Dose Per Fraction: 2.4 Gy
Plan Total Fractions Prescribed: 13
Plan Total Prescribed Dose: 31.2 Gy
Reference Point Dosage Given to Date: 59.4 Gy
Reference Point Session Dosage Given: 2.4 Gy
Session Number: 31

## 2022-09-05 ENCOUNTER — Other Ambulatory Visit: Payer: Self-pay

## 2022-09-05 ENCOUNTER — Ambulatory Visit
Admission: RE | Admit: 2022-09-05 | Discharge: 2022-09-05 | Disposition: A | Discharge status: Home / Self Care | Payer: BC Managed Care – PPO | Source: Ambulatory Visit | Attending: Radiation Oncology | Admitting: Radiation Oncology

## 2022-09-05 DIAGNOSIS — Z510 Encounter for antineoplastic radiation therapy: Secondary | ICD-10-CM | POA: Diagnosis not present

## 2022-09-05 LAB — RAD ONC ARIA SESSION SUMMARY
Course Elapsed Days: 48
Plan Fractions Treated to Date: 7
Plan Prescribed Dose Per Fraction: 2.4 Gy
Plan Total Fractions Prescribed: 13
Plan Total Prescribed Dose: 31.2 Gy
Reference Point Dosage Given to Date: 61.8 Gy
Reference Point Session Dosage Given: 2.4 Gy
Session Number: 32

## 2022-09-06 ENCOUNTER — Other Ambulatory Visit: Payer: Self-pay

## 2022-09-06 ENCOUNTER — Ambulatory Visit
Admission: RE | Admit: 2022-09-06 | Discharge: 2022-09-06 | Disposition: A | Discharge status: Home / Self Care | Payer: BC Managed Care – PPO | Source: Ambulatory Visit | Attending: Radiation Oncology | Admitting: Radiation Oncology

## 2022-09-06 DIAGNOSIS — Z510 Encounter for antineoplastic radiation therapy: Secondary | ICD-10-CM | POA: Diagnosis not present

## 2022-09-06 LAB — RAD ONC ARIA SESSION SUMMARY
Course Elapsed Days: 49
Plan Fractions Treated to Date: 8
Plan Prescribed Dose Per Fraction: 2.4 Gy
Plan Total Fractions Prescribed: 13
Plan Total Prescribed Dose: 31.2 Gy
Reference Point Dosage Given to Date: 64.2 Gy
Reference Point Session Dosage Given: 2.4 Gy
Session Number: 33

## 2022-09-07 ENCOUNTER — Ambulatory Visit
Admission: RE | Admit: 2022-09-07 | Discharge: 2022-09-07 | Disposition: A | Discharge status: Home / Self Care | Payer: BC Managed Care – PPO | Source: Ambulatory Visit | Attending: Radiation Oncology | Admitting: Radiation Oncology

## 2022-09-07 ENCOUNTER — Other Ambulatory Visit: Payer: Self-pay

## 2022-09-07 DIAGNOSIS — Z510 Encounter for antineoplastic radiation therapy: Secondary | ICD-10-CM | POA: Diagnosis not present

## 2022-09-07 LAB — RAD ONC ARIA SESSION SUMMARY
Course Elapsed Days: 50
Plan Fractions Treated to Date: 9
Plan Prescribed Dose Per Fraction: 2.4 Gy
Plan Total Fractions Prescribed: 13
Plan Total Prescribed Dose: 31.2 Gy
Reference Point Dosage Given to Date: 66.6 Gy
Reference Point Session Dosage Given: 2.4 Gy
Session Number: 34

## 2022-09-10 ENCOUNTER — Ambulatory Visit
Admission: RE | Admit: 2022-09-10 | Discharge: 2022-09-10 | Disposition: A | Discharge status: Home / Self Care | Payer: BC Managed Care – PPO | Source: Ambulatory Visit | Attending: Radiation Oncology | Admitting: Radiation Oncology

## 2022-09-10 ENCOUNTER — Other Ambulatory Visit: Payer: Self-pay

## 2022-09-10 DIAGNOSIS — Z510 Encounter for antineoplastic radiation therapy: Secondary | ICD-10-CM | POA: Diagnosis not present

## 2022-09-10 LAB — RAD ONC ARIA SESSION SUMMARY
Course Elapsed Days: 53
Plan Fractions Treated to Date: 10
Plan Prescribed Dose Per Fraction: 2.4 Gy
Plan Total Fractions Prescribed: 13
Plan Total Prescribed Dose: 31.2 Gy
Reference Point Dosage Given to Date: 69 Gy
Reference Point Session Dosage Given: 2.4 Gy
Session Number: 35

## 2022-09-11 ENCOUNTER — Ambulatory Visit
Admission: RE | Admit: 2022-09-11 | Discharge: 2022-09-11 | Disposition: A | Discharge status: Home / Self Care | Payer: BC Managed Care – PPO | Source: Ambulatory Visit | Attending: Radiation Oncology | Admitting: Radiation Oncology

## 2022-09-11 ENCOUNTER — Other Ambulatory Visit: Payer: Self-pay

## 2022-09-11 ENCOUNTER — Ambulatory Visit: Payer: BC Managed Care – PPO

## 2022-09-11 DIAGNOSIS — Z510 Encounter for antineoplastic radiation therapy: Secondary | ICD-10-CM | POA: Diagnosis not present

## 2022-09-11 LAB — RAD ONC ARIA SESSION SUMMARY
Course Elapsed Days: 54
Plan Fractions Treated to Date: 11
Plan Prescribed Dose Per Fraction: 2.4 Gy
Plan Total Fractions Prescribed: 13
Plan Total Prescribed Dose: 31.2 Gy
Reference Point Dosage Given to Date: 71.4 Gy
Reference Point Session Dosage Given: 2.4 Gy
Session Number: 36

## 2022-09-11 NOTE — Progress Notes (Signed)
Pt scheduled to follow up with Dr. Louis Meckel on 10/5 @ 2:30.  Pt notified.  Per requested upcoming appointments e-mail to patient.   Patient denies any needs at this time.

## 2022-09-12 ENCOUNTER — Ambulatory Visit: Payer: BC Managed Care – PPO

## 2022-09-12 ENCOUNTER — Other Ambulatory Visit: Payer: Self-pay

## 2022-09-12 ENCOUNTER — Ambulatory Visit
Admission: RE | Admit: 2022-09-12 | Discharge: 2022-09-12 | Disposition: A | Discharge status: Home / Self Care | Payer: BC Managed Care – PPO | Source: Ambulatory Visit | Attending: Radiation Oncology | Admitting: Radiation Oncology

## 2022-09-12 DIAGNOSIS — Z510 Encounter for antineoplastic radiation therapy: Secondary | ICD-10-CM | POA: Diagnosis not present

## 2022-09-12 LAB — RAD ONC ARIA SESSION SUMMARY
Course Elapsed Days: 55
Plan Fractions Treated to Date: 12
Plan Prescribed Dose Per Fraction: 2.4 Gy
Plan Total Fractions Prescribed: 13
Plan Total Prescribed Dose: 31.2 Gy
Reference Point Dosage Given to Date: 73.8 Gy
Reference Point Session Dosage Given: 2.4 Gy
Session Number: 37

## 2022-09-13 ENCOUNTER — Ambulatory Visit
Admission: RE | Admit: 2022-09-13 | Discharge: 2022-09-13 | Disposition: A | Discharge status: Home / Self Care | Payer: BC Managed Care – PPO | Source: Ambulatory Visit | Attending: Radiation Oncology | Admitting: Radiation Oncology

## 2022-09-13 ENCOUNTER — Encounter: Payer: Self-pay | Admitting: Urology

## 2022-09-13 ENCOUNTER — Ambulatory Visit: Payer: BC Managed Care – PPO

## 2022-09-13 ENCOUNTER — Other Ambulatory Visit: Payer: Self-pay

## 2022-09-13 DIAGNOSIS — C61 Malignant neoplasm of prostate: Secondary | ICD-10-CM

## 2022-09-13 DIAGNOSIS — Z510 Encounter for antineoplastic radiation therapy: Secondary | ICD-10-CM | POA: Diagnosis not present

## 2022-09-13 LAB — RAD ONC ARIA SESSION SUMMARY
Course Elapsed Days: 56
Plan Fractions Treated to Date: 13
Plan Prescribed Dose Per Fraction: 2.4 Gy
Plan Total Fractions Prescribed: 13
Plan Total Prescribed Dose: 31.2 Gy
Reference Point Dosage Given to Date: 76.2 Gy
Reference Point Session Dosage Given: 2.4 Gy
Session Number: 38

## 2022-09-20 ENCOUNTER — Inpatient Hospital Stay: Payer: BC Managed Care – PPO | Attending: Oncology

## 2022-09-20 ENCOUNTER — Inpatient Hospital Stay (HOSPITAL_BASED_OUTPATIENT_CLINIC_OR_DEPARTMENT_OTHER): Payer: BC Managed Care – PPO | Admitting: Oncology

## 2022-09-20 ENCOUNTER — Other Ambulatory Visit: Payer: Self-pay | Admitting: Oncology

## 2022-09-20 ENCOUNTER — Other Ambulatory Visit: Payer: Self-pay

## 2022-09-20 VITALS — BP 158/92 | HR 63 | Temp 98.4°F | Resp 18 | Wt 211.0 lb

## 2022-09-20 DIAGNOSIS — C61 Malignant neoplasm of prostate: Secondary | ICD-10-CM

## 2022-09-20 DIAGNOSIS — C779 Secondary and unspecified malignant neoplasm of lymph node, unspecified: Secondary | ICD-10-CM | POA: Insufficient documentation

## 2022-09-20 LAB — CBC WITH DIFFERENTIAL (CANCER CENTER ONLY)
Abs Immature Granulocytes: 0.02 10*3/uL (ref 0.00–0.07)
Basophils Absolute: 0 10*3/uL (ref 0.0–0.1)
Basophils Relative: 1 %
Eosinophils Absolute: 0.1 10*3/uL (ref 0.0–0.5)
Eosinophils Relative: 3 %
HCT: 37.8 % — ABNORMAL LOW (ref 39.0–52.0)
Hemoglobin: 13.9 g/dL (ref 13.0–17.0)
Immature Granulocytes: 1 %
Lymphocytes Relative: 12 %
Lymphs Abs: 0.4 10*3/uL — ABNORMAL LOW (ref 0.7–4.0)
MCH: 28.6 pg (ref 26.0–34.0)
MCHC: 36.8 g/dL — ABNORMAL HIGH (ref 30.0–36.0)
MCV: 77.8 fL — ABNORMAL LOW (ref 80.0–100.0)
Monocytes Absolute: 0.4 10*3/uL (ref 0.1–1.0)
Monocytes Relative: 12 %
Neutro Abs: 2.6 10*3/uL (ref 1.7–7.7)
Neutrophils Relative %: 71 %
Platelet Count: 224 10*3/uL (ref 150–400)
RBC: 4.86 MIL/uL (ref 4.22–5.81)
RDW: 15.1 % (ref 11.5–15.5)
WBC Count: 3.6 10*3/uL — ABNORMAL LOW (ref 4.0–10.5)
nRBC: 0 % (ref 0.0–0.2)

## 2022-09-20 LAB — CMP (CANCER CENTER ONLY)
ALT: 21 U/L (ref 0–44)
AST: 22 U/L (ref 15–41)
Albumin: 4.2 g/dL (ref 3.5–5.0)
Alkaline Phosphatase: 53 U/L (ref 38–126)
Anion gap: 5 (ref 5–15)
BUN: 10 mg/dL (ref 8–23)
CO2: 30 mmol/L (ref 22–32)
Calcium: 9.2 mg/dL (ref 8.9–10.3)
Chloride: 105 mmol/L (ref 98–111)
Creatinine: 0.96 mg/dL (ref 0.61–1.24)
GFR, Estimated: >60 mL/min (ref >60)
Glucose, Bld: 101 mg/dL — ABNORMAL HIGH (ref 70–99)
Potassium: 3.3 mmol/L — ABNORMAL LOW (ref 3.5–5.1)
Sodium: 140 mmol/L (ref 135–145)
Total Bilirubin: 0.6 mg/dL (ref 0.3–1.2)
Total Protein: 7.2 g/dL (ref 6.5–8.1)

## 2022-09-20 NOTE — Progress Notes (Signed)
Hematology and Oncology Follow Up Visit  Mark Figueroa [id number] September 22, 1961 61 y.o. 09/20/2022 8:15 AM Pcp, Hoyle Sauer, MD   Principle Diagnosis: 61 year old with with prostate cancer diagnosed in September 2022 after presenting with Gleason score 9.  He developed castration-sensitive advanced disease with lymphadenopathy in April 2023.   Prior Therapy:  He underwent robotic assisted laparoscopic radical prostatectomy and bilateral extensive lymph node dose dissection on November 22, 2021.  The final pathology showed prostate adenocarcinoma Gleason score 9 with extraprostatic extension and lymphovascular invasion.  Margins focally positive with a 0/8 total lymph nodes showed malignancy.  The final pathological staging was T3bN0.    His follow-up PSA in January 2023 was 1.18 and on April 16, 2022 was 28.  This was repeated on April 23, 2022 was 27.6.  Based on these findings he underwent PSMA pet imaging which showed soft tissue mass within the central low-attenuation along the right pelvic sidewall measuring 2.8 x 4.1 cm with SUV of 4.53 and a left pelvic sidewall lymph node measuring 1.6 x 1.5 cm with SUV of 2.84.  Firmagon 240 mg started on June 18, 2022.   Last injection given on July 10, 2022.  Radiation therapy to the prostate and involved lymph nodes.  He completed total of 76.2 Gray in 13 fractions in September 2023  Current therapy:   He received Eligard 30 mg on August 08, 2022.  This will be repeated in 4 months.  Zytiga 1000 mg daily with prednisone started on June 13, 2022.  Interim History: Mark Figueroa is here for a repeat evaluation.  Since the last visit, he reports feeling well without any major complaints.  He denies any nausea, vomiting or abdominal pain.  He denies any recent hospitalizations or illnesses.  He completed radiation therapy without any new complaints.  His performance status quality of life remains unchanged.    Medications: Updated on  review. Current Outpatient Medications  Medication Sig Dispense Refill   abiraterone acetate (ZYTIGA) 250 MG tablet TAKE 4 TABLETS (1,000 MG TOTAL) BY MOUTH DAILY. TAKE ON AN EMPTY STOMACH 1 HOUR BEFORE OR 2 HOURS AFTER A MEAL 120 tablet 0   aspirin EC 81 MG tablet Take 81 mg by mouth daily. Swallow whole. (Patient not taking: Reported on 06/06/2022)     atorvastatin (LIPITOR) 20 MG tablet Take 20 mg by mouth daily.     hydrochlorothiazide (HYDRODIURIL) 25 MG tablet Take 25 mg by mouth daily.     losartan (COZAAR) 100 MG tablet Take 100 mg by mouth daily.     predniSONE (DELTASONE) 5 MG tablet Take 1 tablet (5 mg total) by mouth daily with breakfast. 90 tablet 1   tadalafil (CIALIS) 20 MG tablet Take 20 mg by mouth daily as needed for erectile dysfunction.     No current facility-administered medications for this visit.     Allergies: No Known Allergies   Physical Exam: Blood pressure (!) 158/92, pulse 63, temperature 98.4 F (36.9 C), temperature source Oral, resp. rate 18, weight 211 lb (95.7 kg), SpO2 100 %.  ECOG: 0   General appearance: Alert, awake without any distress. Head: Atraumatic without abnormalities Oropharynx: Without any thrush or ulcers. Eyes: No scleral icterus. Lymph nodes: No lymphadenopathy noted in the cervical, supraclavicular, or axillary nodes Heart:regular rate and rhythm, without any murmurs or gallops.   Lung: Clear to auscultation without any rhonchi, wheezes or dullness to percussion. Abdomin: Soft, nontender without any shifting dullness or ascites. Musculoskeletal: No clubbing or cyanosis.  Neurological: No motor or sensory deficits. Skin: No rashes or lesions.     Lab Results: Lab Results  Component Value Date   WBC 5.3 07/10/2022   HGB 15.9 07/10/2022   HCT 42.9 07/10/2022   MCV 74.4 (L) 07/10/2022   PLT 276 07/10/2022     Chemistry      Component Value Date/Time   NA 139 07/10/2022 1116   K 3.8 07/10/2022 1116   CL 103 07/10/2022  1116   CO2 29 07/10/2022 1116   BUN 20 07/10/2022 1116   CREATININE 1.00 07/10/2022 1116      Component Value Date/Time   CALCIUM 9.8 07/10/2022 1116   ALKPHOS 56 07/10/2022 1116   AST 29 07/10/2022 1116   ALT 33 07/10/2022 1116   BILITOT 0.8 07/10/2022 1116       Latest Reference Range & Units 06/06/22 14:41 07/10/22 11:16  Prostate Specific Ag, Serum 0.0 - 4.0 ng/mL 36.8 (H) 0.9  (H): Data is abnormally high    Impression and Plan:   61 year old with:  1.  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   He is currently on Zytiga with excellent PSA response noted on July 10, 2022.  Risks and benefits of continuing this treatment long-term were discussed.  Complications that include nausea, fatigue, hypertension and adrenal insufficiency among others.  Different salvage therapy options such as chemotherapy and PARP inhibitors will be deferred at this time.  He is agreeable to continue at this time.   2.  Androgen deprivation therapy: He is currently on Eligard which will be repeated in December 2023.    3.  Local therapy considerations: He is status post radiation therapy to the prostate and involved lymph nodes completed in September 2023.  4.  Hypertension: His blood pressure is mildly elevated and will continue to monitor on Zytiga.   5.  Hypokalemia: No issues reported with Zytiga with potassium within normal range.  We will continue to monitor and subsequent visits.   6.  Follow-up: He will return in December 2023 for repeat evaluation and the next Eligard injection.   30  minutes were dedicated to this encounter.  The time was spent on reviewing laboratory data, disease status update and outlining future plan of care discussion.     Zola Button, MD 9/21/20238:15 AM

## 2022-09-21 ENCOUNTER — Telehealth: Payer: Self-pay

## 2022-09-21 LAB — PROSTATE-SPECIFIC AG, SERUM (LABCORP): Prostate Specific Ag, Serum: <0.1 ng/mL (ref 0.0–4.0)

## 2022-09-21 NOTE — Telephone Encounter (Addendum)
Spoke with patient to relay PSA results from 09/20/22.  ----- Message from Wyatt Portela, MD sent at 09/21/2022  9:42 AM EDT ----- Please let him know his PSA is still low

## 2022-10-10 ENCOUNTER — Encounter: Payer: Self-pay | Admitting: Urology

## 2022-10-10 NOTE — Progress Notes (Signed)
  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 (as documented in provider EOT note)   Pre Treatment IPSS Score: 3  The patient was not available for call today. I spoke w/ patient's spouse [name], verified her identity and began nursing interview.  Symptoms of fatigue have improved since completing therapy.  Symptoms of bladder changes have  improved since completing therapy. Current symptoms include none and medications for bladder symptoms include none.  Symptoms of bowel changes have  improved since completing therapy. Current symptoms include none, and medications for bowel symptoms include none.     Post Treatment IPSS Score: IPSS Questionnaire (AUA-7): Over the past month.   1)  How often have you had a sensation of not emptying your bladder completely after you finish urinating?  0 - Not at all  2)  How often have you had to urinate again less than two hours after you finished urinating? 0 - Not at all  3)  How often have you found you stopped and started again several times when you urinated?  0 - Not at all  4) How difficult have you found it to postpone urination?  0 - Not at all  5) How often have you had a weak urinary stream?  0 - Not at all  6) How often have you had to push or strain to begin urination?  0 - Not at all  7) How many times did you most typically get up to urinate from the time you went to bed until the time you got up in the morning?  3 - 3 times  Total score:  3. Which indicates mild symptoms  0-7 mildly symptomatic   8-19 moderately symptomatic   20-35 severely symptomatic   Urology appt- Oct 30, 2022 w/ Dr. Louis Meckel or Jiles Crocker NP  Patient's was advised to keep his post-treatment follow up with his urologist Dr. Louis Meckel for ongoing surveillance. He was counseled that PSA levels will be drawn in his urologist office, and was reassured that additional time is expected to improve bowel and bladder symptoms. He was encouraged to call back with concerns or questions regarding radiation.  This concludes the nursing interview.   Leandra Kern, LPN

## 2022-10-11 ENCOUNTER — Ambulatory Visit
Admission: RE | Admit: 2022-10-11 | Discharge: 2022-10-11 | Disposition: A | Discharge status: Home / Self Care | Payer: BC Managed Care – PPO | Source: Ambulatory Visit | Attending: Urology | Admitting: Urology

## 2022-10-11 NOTE — Progress Notes (Signed)
  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      Indication for treatment:  Curative, Definitive Radiotherapy       Radiation treatment dates:   07/19/22 - 09/13/22  Site/dose:  1. The prostate fossa and pelvic lymph nodes were initially treated to 45 Gy in 25 fractions of 1.8 Gy  2. The prostate fossa only was boosted to 68.4 Gy with 13 additional fractions of 1.8 Gy with simultaneous boost to 76.2 Gy in 13 fractions of 2.4 Gy to the involved nodes.   Beams/energy:  1. The prostate fossa  and pelvic lymph nodes were initially treated using VMAT intensity modulated radiotherapy delivering 6 megavolt photons. Image guidance was performed with CB-CT studies prior to each fraction. He was immobilized with a body fix lower extremity mold.  2. The prostate fossa and involved lymph nodes were boosted using VMAT intensity modulated radiotherapy delivering 6 megavolt photons. Image guidance was performed with CB-CT studies prior to each fraction. He was immobilized with a body fix lower extremity mold.  Narrative: The patient tolerated radiation treatment relatively well with only minor urinary irritation with increased nocturia x3 as well as modest fatigue.  He also reported constipation that was managed with Miralax as needed.  Plan: The patient has completed radiation treatment. He will return to radiation oncology clinic for routine followup in one month. I advised him to call or return sooner if he has any questions or concerns related to his recovery or treatment. ________________________________  Sheral Apley. Tammi Klippel, M.D.

## 2022-10-19 ENCOUNTER — Telehealth: Payer: Self-pay

## 2022-10-19 ENCOUNTER — Other Ambulatory Visit: Payer: Self-pay | Admitting: Oncology

## 2022-10-19 DIAGNOSIS — C61 Malignant neoplasm of prostate: Secondary | ICD-10-CM

## 2022-10-19 NOTE — Telephone Encounter (Signed)
Pt LVM stating he forgot to take his rx Zytiga this morning and wanted to know if it was okay to miss the one dose.  I attempted to call the pt back. I was unable to leave a message as his VM box is full.

## 2022-10-22 ENCOUNTER — Telehealth: Payer: Self-pay | Admitting: *Deleted

## 2022-10-22 NOTE — Telephone Encounter (Signed)
PC to patient regarding message left on 10/19/22 about missing a dose of his Zytiga, no answer, unable to leave VM as patient's voice mailbox is full.

## 2022-10-31 ENCOUNTER — Ambulatory Visit: Payer: Self-pay | Admitting: Urology

## 2022-11-16 ENCOUNTER — Other Ambulatory Visit: Payer: Self-pay | Admitting: Oncology

## 2022-11-16 DIAGNOSIS — C61 Malignant neoplasm of prostate: Secondary | ICD-10-CM

## 2022-11-27 ENCOUNTER — Other Ambulatory Visit (HOSPITAL_COMMUNITY): Payer: Self-pay

## 2022-11-27 ENCOUNTER — Telehealth: Payer: Self-pay | Admitting: Pharmacy Technician

## 2022-11-27 NOTE — Telephone Encounter (Signed)
Oral Oncology Patient Advocate Encounter  Prior Authorization for Abiraterone has been approved.    PA# [id number] Effective dates: 11/27/22 through 11/28/23  Patient may continue to fill at [facility].   Lady Deutscher, CPhT-Adv Oncology Pharmacy Patient [place] Direct Number: [phone]  Fax: [phone]

## 2022-11-27 NOTE — Telephone Encounter (Signed)
Oral Oncology Patient Advocate Encounter   Received notification that prior authorization for Abiraterone is due for renewal.   PA submitted on 11/27/22  PA submitted to [facility]  Phone: [phone] Fax: [phone]  Status is pending     Lady Deutscher, [facility] Patient [place] Direct Number: [phone]  Fax: [phone]

## 2022-12-06 ENCOUNTER — Inpatient Hospital Stay: Payer: BC Managed Care – PPO | Attending: Oncology

## 2022-12-06 ENCOUNTER — Inpatient Hospital Stay: Payer: BC Managed Care – PPO

## 2022-12-06 ENCOUNTER — Other Ambulatory Visit: Payer: Self-pay

## 2022-12-06 ENCOUNTER — Inpatient Hospital Stay (HOSPITAL_BASED_OUTPATIENT_CLINIC_OR_DEPARTMENT_OTHER): Payer: BC Managed Care – PPO | Admitting: Oncology

## 2022-12-06 VITALS — BP 146/90 | HR 73 | Temp 98.0°F | Resp 18 | Ht 67.0 in | Wt 216.1 lb

## 2022-12-06 DIAGNOSIS — Z7952 Long term (current) use of systemic steroids: Secondary | ICD-10-CM | POA: Insufficient documentation

## 2022-12-06 DIAGNOSIS — C779 Secondary and unspecified malignant neoplasm of lymph node, unspecified: Secondary | ICD-10-CM | POA: Diagnosis not present

## 2022-12-06 DIAGNOSIS — I10 Essential (primary) hypertension: Secondary | ICD-10-CM | POA: Insufficient documentation

## 2022-12-06 DIAGNOSIS — C61 Malignant neoplasm of prostate: Secondary | ICD-10-CM

## 2022-12-06 DIAGNOSIS — E876 Hypokalemia: Secondary | ICD-10-CM | POA: Insufficient documentation

## 2022-12-06 LAB — CMP (CANCER CENTER ONLY)
ALT: 32 U/L (ref 0–44)
AST: 24 U/L (ref 15–41)
Albumin: 4.1 g/dL (ref 3.5–5.0)
Alkaline Phosphatase: 56 U/L (ref 38–126)
Anion gap: 6 (ref 5–15)
BUN: 10 mg/dL (ref 8–23)
CO2: 32 mmol/L (ref 22–32)
Calcium: 9.6 mg/dL (ref 8.9–10.3)
Chloride: 104 mmol/L (ref 98–111)
Creatinine: 0.82 mg/dL (ref 0.61–1.24)
GFR, Estimated: >60 mL/min (ref >60)
Glucose, Bld: 82 mg/dL (ref 70–99)
Potassium: 3.8 mmol/L (ref 3.5–5.1)
Sodium: 142 mmol/L (ref 135–145)
Total Bilirubin: 0.7 mg/dL (ref 0.3–1.2)
Total Protein: 7.2 g/dL (ref 6.5–8.1)

## 2022-12-06 LAB — CBC WITH DIFFERENTIAL (CANCER CENTER ONLY)
Abs Immature Granulocytes: 0.01 10*3/uL (ref 0.00–0.07)
Basophils Absolute: 0 10*3/uL (ref 0.0–0.1)
Basophils Relative: 1 %
Eosinophils Absolute: 0.1 10*3/uL (ref 0.0–0.5)
Eosinophils Relative: 2 %
HCT: 38.9 % — ABNORMAL LOW (ref 39.0–52.0)
Hemoglobin: 14.2 g/dL (ref 13.0–17.0)
Immature Granulocytes: 0 %
Lymphocytes Relative: 10 %
Lymphs Abs: 0.5 10*3/uL — ABNORMAL LOW (ref 0.7–4.0)
MCH: 28.9 pg (ref 26.0–34.0)
MCHC: 36.5 g/dL — ABNORMAL HIGH (ref 30.0–36.0)
MCV: 79.2 fL — ABNORMAL LOW (ref 80.0–100.0)
Monocytes Absolute: 0.6 10*3/uL (ref 0.1–1.0)
Monocytes Relative: 11 %
Neutro Abs: 3.8 10*3/uL (ref 1.7–7.7)
Neutrophils Relative %: 76 %
Platelet Count: 233 10*3/uL (ref 150–400)
RBC: 4.91 MIL/uL (ref 4.22–5.81)
RDW: 13.2 % (ref 11.5–15.5)
WBC Count: 5 10*3/uL (ref 4.0–10.5)
nRBC: 0 % (ref 0.0–0.2)

## 2022-12-06 MED ORDER — LEUPROLIDE ACETATE (4 MONTH) 30 MG ~~LOC~~ KIT
30.0000 mg | PACK | Freq: Once | SUBCUTANEOUS | Status: AC
  Administered 2022-12-06: 30 mg via SUBCUTANEOUS
  Filled 2022-12-06: qty 30

## 2022-12-06 NOTE — Progress Notes (Signed)
Hematology and Oncology Follow Up Visit  Mark Figueroa [id number] 1961-03-21 61 y.o. 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  He presented with Gleason score 9 localized disease in 2022.   Prior Therapy:  He underwent robotic assisted laparoscopic radical prostatectomy and bilateral extensive lymph node dose dissection on November 22, 2021.  The final pathology showed prostate adenocarcinoma Gleason score 9 with extraprostatic extension and lymphovascular invasion.  Margins focally positive with a 0/8 total lymph nodes showed malignancy.  The final pathological staging was T3bN0.    His follow-up PSA in January 2023 was 1.18 and on April 16, 2022 was 28.  This was repeated on April 23, 2022 was 27.6.  Based on these findings he underwent PSMA pet imaging which showed soft tissue mass within the central low-attenuation along the right pelvic sidewall measuring 2.8 x 4.1 cm with SUV of 4.53 and a left pelvic sidewall lymph node measuring 1.6 x 1.5 cm with SUV of 2.84.  Firmagon 240 mg started on June 18, 2022.   Last injection given on July 10, 2022.  Radiation therapy to the prostate and involved lymph nodes.  He completed total of 76.2 Gray in 13 fractions in September 2023  Current therapy:    Eligard 30 mg every 4 months started in August 04, 2022.  Will receive his next injection today.  Zytiga 1000 mg daily with prednisone started on June 13, 2022.  Interim History: Mark Figueroa returns today for a follow-up visit.  Since the last visit, he reports no major changes in his health.  He continues to tolerate Zytiga without any major complaints.  He denies any nausea, vomiting or abdominal pain.  He denies excessive fatigue or tiredness or weakness.  He denies any hospitalizations or illnesses.  His performance status and quality of life remains  unchanged.    Medications: Updated on review. Current Outpatient Medications  Medication Sig Dispense Refill   abiraterone acetate (ZYTIGA) 250 MG tablet TAKE 4 TABLETS (1,'000MG'$  TOTAL) BY MOUTH DAILY.  TAKE ON EMPTY STOMACH 1 HOUR BEFORE OR 2 HOURS AFTER A MEAL. 120 tablet 0   aspirin EC 81 MG tablet Take 81 mg by mouth daily. Swallow whole.     atorvastatin (LIPITOR) 20 MG tablet Take 20 mg by mouth daily.     hydrochlorothiazide (HYDRODIURIL) 25 MG tablet Take 25 mg by mouth daily.     losartan (COZAAR) 100 MG tablet Take 100 mg by mouth daily.     predniSONE (DELTASONE) 5 MG tablet Take 1 tablet (5 mg total) by mouth daily with breakfast. 90 tablet 1   tadalafil (CIALIS) 20 MG tablet Take 20 mg by mouth daily as needed for erectile dysfunction.     No current facility-administered medications for this visit.     Allergies: No Known Allergies   Physical Exam: Blood pressure (!) 146/90, pulse 73, temperature 98 F (36.7 C), temperature source Temporal, resp. rate 18, height '5\' 7"'$  (1.702 m), weight 216 lb 1.6 oz (98 kg), SpO2 99 %.   ECOG: 0   General appearance: Comfortable appearing without any discomfort Head: Normocephalic without any trauma Oropharynx: Mucous membranes are moist and pink without any thrush or ulcers. Eyes: Pupils are equal and round reactive to light. Lymph nodes: No cervical, supraclavicular, inguinal or axillary lymphadenopathy.   Heart:regular rate and rhythm.  S1 and S2 without leg edema. Lung: Clear without any rhonchi  or wheezes.  No dullness to percussion. Abdomin: Soft, nontender, nondistended with good bowel sounds.  No hepatosplenomegaly. Musculoskeletal: No joint deformity or effusion.  Full range of motion noted. Neurological: No deficits noted on motor, sensory and deep tendon reflex exam. Skin: No petechial rash or dryness.  Appeared moist.      Lab Results: Lab Results  Component Value Date   WBC 3.6 (L) 09/20/2022   HGB 13.9  09/20/2022   HCT 37.8 (L) 09/20/2022   MCV 77.8 (L) 09/20/2022   PLT 224 09/20/2022     Chemistry      Component Value Date/Time   NA 140 09/20/2022 0821   K 3.3 (L) 09/20/2022 0821   CL 105 09/20/2022 0821   CO2 30 09/20/2022 0821   BUN 10 09/20/2022 0821   CREATININE 0.96 09/20/2022 0821      Component Value Date/Time   CALCIUM 9.2 09/20/2022 0821   ALKPHOS 53 09/20/2022 0821   AST 22 09/20/2022 0821   ALT 21 09/20/2022 0821   BILITOT 0.6 09/20/2022 0821       Latest Reference Range & Units 06/06/22 14:41 07/10/22 11:16 09/20/22 08:21  Prostate Specific Ag, Serum 0.0 - 4.0 ng/mL 36.8 (H) 0.9 <0.1  (H): Data is abnormally high   Impression and Plan:   61 year old with:  1.  Advanced prostate cancer with lymphadenopathy noted in April 2023.  He has castration-sensitive disease at this time.   The natural course of his disease was reviewed and updated at this time.  His PSA is undetectable indicating excellent response to treatment.  Risks and benefits of continuing Zytiga were discussed at this time.  Complications that include hypertension, adrenal insufficiency and edema were reiterated.  I recommended continuing the same dose and schedule and switching to different therapy such as a PARP inhibitor, Taxotere chemotherapy or other options if he has progression of disease.  He is agreeable to proceed with this plan and will continue Zytiga unless he has progression of disease   2.  Androgen deprivation therapy: Will receive Eligard today and repeated in 4 months.  Complications including sexual dysfunction, weight gain and hot flashes were reiterated.    3.  Local therapy considerations: He had radiation therapy to the prostate without any complications.  4.  Hypertension: This will be monitored on Zytiga.  His blood pressure is mildly elevated but within normal range between visits.   5.  Hypokalemia: Remains mild and I will be periodically monitored and replaced.   This is related to Zytiga.   6.  Follow-up: In 4 months for a follow-up.   30  minutes were spent on this visit.  The time was dedicated to updating disease status, treatment choices and outlining future plan of care review.     Zola Button, MD 12/7/20239:01 AM

## 2022-12-08 LAB — PROSTATE-SPECIFIC AG, SERUM (LABCORP): Prostate Specific Ag, Serum: <0.1 ng/mL (ref 0.0–4.0)

## 2022-12-10 ENCOUNTER — Telehealth: Payer: Self-pay

## 2022-12-10 NOTE — Telephone Encounter (Signed)
-----   Message from Wyatt Portela, MD sent at 12/10/2022 10:38 AM EST ----- Please let him know his PSA is low

## 2022-12-10 NOTE — Telephone Encounter (Signed)
Pt advised and voiced understanding.   

## 2022-12-17 ENCOUNTER — Other Ambulatory Visit: Payer: Self-pay | Admitting: Oncology

## 2022-12-17 DIAGNOSIS — C61 Malignant neoplasm of prostate: Secondary | ICD-10-CM

## 2023-01-17 ENCOUNTER — Other Ambulatory Visit: Payer: Self-pay | Admitting: Oncology

## 2023-01-17 DIAGNOSIS — C61 Malignant neoplasm of prostate: Secondary | ICD-10-CM

## 2023-02-14 ENCOUNTER — Other Ambulatory Visit: Payer: Self-pay | Admitting: Hematology and Oncology

## 2023-02-14 DIAGNOSIS — C61 Malignant neoplasm of prostate: Secondary | ICD-10-CM

## 2023-03-22 ENCOUNTER — Other Ambulatory Visit: Payer: Self-pay | Admitting: Hematology and Oncology

## 2023-03-22 DIAGNOSIS — C61 Malignant neoplasm of prostate: Secondary | ICD-10-CM

## 2023-03-25 ENCOUNTER — Other Ambulatory Visit: Payer: Self-pay | Admitting: Hematology and Oncology

## 2023-03-25 DIAGNOSIS — C61 Malignant neoplasm of prostate: Secondary | ICD-10-CM

## 2023-03-25 MED ORDER — ABIRATERONE ACETATE 250 MG PO TABS: 1000.0000 mg | ORAL_TABLET | Freq: Every day | ORAL | 0 refills | Status: DC

## 2023-04-03 ENCOUNTER — Other Ambulatory Visit: Payer: Self-pay

## 2023-04-03 DIAGNOSIS — C61 Malignant neoplasm of prostate: Secondary | ICD-10-CM

## 2023-04-04 ENCOUNTER — Ambulatory Visit: Payer: BC Managed Care – PPO

## 2023-04-04 ENCOUNTER — Ambulatory Visit: Payer: BC Managed Care – PPO | Admitting: Hematology and Oncology

## 2023-04-04 ENCOUNTER — Other Ambulatory Visit: Payer: BC Managed Care – PPO

## 2023-04-04 NOTE — Progress Notes (Deleted)
Hematology and Oncology Follow Up Visit  Mark Figueroa [id number] Jan 02, 1961 62 y.o. 04/04/2023 1:54 PM Pcp, NoNo ref. provider found   Principle Diagnosis: 62 year old with with prostate cancer diagnosed in September 2022 after presenting with Gleason score 9.  He developed castration-sensitive advanced disease with lymphadenopathy in April 2023.   Prior Therapy:  He underwent robotic assisted laparoscopic radical prostatectomy and bilateral extensive lymph node dose dissection on November 22, 2021.  The final pathology showed prostate adenocarcinoma Gleason score 9 with extraprostatic extension and lymphovascular invasion.  Margins focally positive with a 0/8 total lymph nodes showed malignancy.  The final pathological staging was T3bN0.    His follow-up PSA in January 2023 was 1.18 and on April 16, 2022 was 28.  This was repeated on April 23, 2022 was 27.6.  Based on these findings he underwent [facility] pet imaging which showed soft tissue mass within the central low-attenuation along the right pelvic sidewall measuring 2.8 x 4.1 cm with SUV of 4.53 and a left pelvic sidewall lymph node measuring 1.6 x 1.5 cm with SUV of 2.84.  Firmagon 240 mg started on June 18, 2022.   Last injection given on July 10, 2022.  Radiation therapy to the prostate and involved lymph nodes.  He completed total of 76.2 Gray in 13 fractions in September 2023  Current therapy:   He received Eligard 30 mg on August 08, 2022.  This will be repeated in 4 months.  Zytiga 1000 mg daily with prednisone started on June 13, 2022.  Interim History: Mark Figueroa is here for a repeat evaluation.  Since the last visit, he reports feeling well without any major complaints.  He denies any nausea, vomiting or abdominal pain.  He denies any recent hospitalizations or illnesses.  He completed radiation therapy without any new complaints.  His performance status quality of life remains unchanged.    Medications: Updated on  review. Current Outpatient Medications  Medication Sig Dispense Refill   abiraterone acetate (ZYTIGA) 250 MG tablet Take 4 tablets (1,000 mg total) by mouth daily. Take on an empty stomach 1 hour before or 2 hours after a meal 120 tablet 0   aspirin EC 81 MG tablet Take 81 mg by mouth daily. Swallow whole.     atorvastatin (LIPITOR) 20 MG tablet Take 20 mg by mouth daily.     hydrochlorothiazide (HYDRODIURIL) 25 MG tablet Take 25 mg by mouth daily.     losartan (COZAAR) 100 MG tablet Take 100 mg by mouth daily.     predniSONE (DELTASONE) 5 MG tablet Take 1 tablet (5 mg total) by mouth daily with breakfast. 90 tablet 1   tadalafil (CIALIS) 20 MG tablet Take 20 mg by mouth daily as needed for erectile dysfunction.     No current facility-administered medications for this visit.     Allergies: No Known Allergies   Physical Exam: There were no vitals taken for this visit.  ECOG: 0   General appearance: Alert, awake without any distress. Head: Atraumatic without abnormalities Oropharynx: Without any thrush or ulcers. Eyes: No scleral icterus. Lymph nodes: No lymphadenopathy noted in the cervical, supraclavicular, or axillary nodes Heart:regular rate and rhythm, without any murmurs or gallops.   Lung: Clear to auscultation without any rhonchi, wheezes or dullness to percussion. Abdomin: Soft, nontender without any shifting dullness or ascites. Musculoskeletal: No clubbing or cyanosis. Neurological: No motor or sensory deficits. Skin: No rashes or lesions.     Lab Results: Lab Results  Component Value Date  WBC 5.0 12/06/2022   HGB 14.2 12/06/2022   HCT 38.9 (L) 12/06/2022   MCV 79.2 (L) 12/06/2022   PLT 233 12/06/2022     Chemistry      Component Value Date/Time   NA 142 12/06/2022 0902   K 3.8 12/06/2022 0902   CL 104 12/06/2022 0902   CO2 32 12/06/2022 0902   BUN 10 12/06/2022 0902   CREATININE 0.82 12/06/2022 0902      Component Value Date/Time   CALCIUM 9.6  12/06/2022 0902   ALKPHOS 56 12/06/2022 0902   AST 24 12/06/2022 0902   ALT 32 12/06/2022 0902   BILITOT 0.7 12/06/2022 0902       Latest Reference Range & Units 06/06/22 14:41 07/10/22 11:16  Prostate Specific Ag, Serum 0.0 - 4.0 ng/mL 36.8 (H) 0.9  (H): Data is abnormally high    Impression and Plan:   62 year old with:  1.  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   He is currently on Zytiga with excellent PSA response noted on July 10, 2022.  Risks and benefits of continuing this treatment long-term were discussed.  Complications that include nausea, fatigue, hypertension and adrenal insufficiency among others.  Different salvage therapy options such as chemotherapy and PARP inhibitors will be deferred at this time.  He is agreeable to continue at this time.   2.  Androgen deprivation therapy: He is currently on Eligard which will be repeated in December 2023.   3.  Local therapy considerations: He is status post radiation therapy to the prostate and involved lymph nodes completed in September 2023.  4.  Hypertension: His blood pressure is mildly elevated and will continue to monitor on Zytiga.   5.  Hypokalemia: No issues reported with Zytiga with potassium within normal range.  We will continue to monitor and subsequent visits.  6.  Follow-up: He will return in December 2023 for repeat evaluation and the next Eligard injection.   30  minutes were dedicated to this encounter.  The time was spent on reviewing laboratory data, disease status update and outlining future plan of care discussion.     Benay Pike, MD 4/4/20241:54 PM

## 2023-04-05 ENCOUNTER — Telehealth: Payer: Self-pay | Admitting: Hematology and Oncology

## 2023-04-05 NOTE — Telephone Encounter (Signed)
Left patient a vm regarding rescheduled appointments from 4/4

## 2023-04-11 ENCOUNTER — Inpatient Hospital Stay: Payer: BC Managed Care – PPO | Admitting: Adult Health

## 2023-04-11 ENCOUNTER — Inpatient Hospital Stay: Payer: BC Managed Care – PPO

## 2023-04-11 ENCOUNTER — Inpatient Hospital Stay: Payer: BC Managed Care – PPO | Attending: Adult Health

## 2023-04-11 ENCOUNTER — Encounter: Payer: Self-pay | Admitting: Adult Health

## 2023-04-11 ENCOUNTER — Other Ambulatory Visit: Payer: Self-pay

## 2023-04-11 VITALS — BP 111/69 | HR 72 | Temp 97.7°F | Resp 16 | Ht 67.0 in | Wt 216.2 lb

## 2023-04-11 DIAGNOSIS — I10 Essential (primary) hypertension: Secondary | ICD-10-CM | POA: Diagnosis not present

## 2023-04-11 DIAGNOSIS — Z7952 Long term (current) use of systemic steroids: Secondary | ICD-10-CM | POA: Insufficient documentation

## 2023-04-11 DIAGNOSIS — R7401 Elevation of levels of liver transaminase levels: Secondary | ICD-10-CM | POA: Diagnosis not present

## 2023-04-11 DIAGNOSIS — C61 Malignant neoplasm of prostate: Secondary | ICD-10-CM | POA: Insufficient documentation

## 2023-04-11 DIAGNOSIS — E7800 Pure hypercholesterolemia, unspecified: Secondary | ICD-10-CM | POA: Insufficient documentation

## 2023-04-11 DIAGNOSIS — C779 Secondary and unspecified malignant neoplasm of lymph node, unspecified: Secondary | ICD-10-CM | POA: Insufficient documentation

## 2023-04-11 LAB — CMP (CANCER CENTER ONLY)
ALT: 91 U/L — ABNORMAL HIGH (ref 0–44)
AST: 56 U/L — ABNORMAL HIGH (ref 15–41)
Albumin: 4.1 g/dL (ref 3.5–5.0)
Alkaline Phosphatase: 57 U/L (ref 38–126)
Anion gap: 7 (ref 5–15)
BUN: 17 mg/dL (ref 8–23)
CO2: 29 mmol/L (ref 22–32)
Calcium: 9.9 mg/dL (ref 8.9–10.3)
Chloride: 103 mmol/L (ref 98–111)
Creatinine: 0.92 mg/dL (ref 0.61–1.24)
GFR, Estimated: >60 mL/min (ref >60)
Glucose, Bld: 93 mg/dL (ref 70–99)
Potassium: 3.7 mmol/L (ref 3.5–5.1)
Sodium: 139 mmol/L (ref 135–145)
Total Bilirubin: 0.8 mg/dL (ref 0.3–1.2)
Total Protein: 7.2 g/dL (ref 6.5–8.1)

## 2023-04-11 LAB — CBC WITH DIFFERENTIAL (CANCER CENTER ONLY)
Abs Immature Granulocytes: 0.03 10*3/uL (ref 0.00–0.07)
Basophils Absolute: 0 10*3/uL (ref 0.0–0.1)
Basophils Relative: 1 %
Eosinophils Absolute: 0.1 10*3/uL (ref 0.0–0.5)
Eosinophils Relative: 2 %
HCT: 38.5 % — ABNORMAL LOW (ref 39.0–52.0)
Hemoglobin: 14.4 g/dL (ref 13.0–17.0)
Immature Granulocytes: 1 %
Lymphocytes Relative: 15 %
Lymphs Abs: 0.6 10*3/uL — ABNORMAL LOW (ref 0.7–4.0)
MCH: 28.3 pg (ref 26.0–34.0)
MCHC: 37.4 g/dL — ABNORMAL HIGH (ref 30.0–36.0)
MCV: 75.6 fL — ABNORMAL LOW (ref 80.0–100.0)
Monocytes Absolute: 0.5 10*3/uL (ref 0.1–1.0)
Monocytes Relative: 11 %
Neutro Abs: 3 10*3/uL (ref 1.7–7.7)
Neutrophils Relative %: 70 %
Platelet Count: 236 10*3/uL (ref 150–400)
RBC: 5.09 MIL/uL (ref 4.22–5.81)
RDW: 14.5 % (ref 11.5–15.5)
WBC Count: 4.2 10*3/uL (ref 4.0–10.5)
nRBC: 0 % (ref 0.0–0.2)

## 2023-04-11 MED ORDER — LEUPROLIDE ACETATE (4 MONTH) 30 MG ~~LOC~~ KIT
30.0000 mg | PACK | Freq: Once | SUBCUTANEOUS | Status: AC
  Administered 2023-04-11: 30 mg via SUBCUTANEOUS
  Filled 2023-04-11: qty 30

## 2023-04-11 NOTE — Progress Notes (Signed)
[facility] Cancer Follow up:    Pcp, No No address on file   DIAGNOSIS:  Cancer Staging  Prostate cancer Staging form: Prostate, AJCC 8th Edition - Clinical stage from 09/07/2021: Stage IVA (cT3, cN1, cM0, PSA: 5, Grade Group: 5) - Signed by Marcello Fennel, PA-C on 06/14/2022 Histopathologic type: Adenocarcinoma, NOS Stage prefix: Initial diagnosis Prostate specific antigen (PSA) range: Less than 10 Gleason primary pattern: 4 Gleason secondary pattern: 5 Gleason score: 9 Histologic grading system: 5 grade system Number of biopsy cores examined: 12 Number of biopsy cores positive: 8 Location of positive needle core biopsies: Both sides   SUMMARY OF ONCOLOGIC HISTORY: Oncology History  Prostate cancer  09/07/2021 Cancer Staging   Staging form: Prostate, AJCC 8th Edition - Clinical stage from 09/07/2021: Stage IVA (cT3, cN1, cM0, PSA: 5, Grade Group: 5) - Signed by Marcello Fennel, PA-C on 06/14/2022 Histopathologic type: Adenocarcinoma, NOS Stage prefix: Initial diagnosis Prostate specific antigen (PSA) range: Less than 10 Gleason primary pattern: 4 Gleason secondary pattern: 5 Gleason score: 9 Histologic grading system: 5 grade system Number of biopsy cores examined: 12 Number of biopsy cores positive: 8 Location of positive needle core biopsies: Both sides   11/22/2021 Initial Diagnosis   Prostate cancer (HCC)   11/22/2021 Surgery   Robotic assisted laparoscopic radical prostatectomy and bilateral extensive lymph node dose dissection on November 22, 2021.  The final pathology showed prostate adenocarcinoma Gleason score 9 with extraprostatic extension and lymphovascular invasion.  Margins focally positive with a 0/8 total lymph nodes showed malignancy.  The final pathological staging was T3bN0.    05/16/2022 PET scan   CLINICAL DATA:  Prostate carcinoma with biochemical recurrence.   EXAM: NUCLEAR MEDICINE PET SKULL BASE TO THIGH   TECHNIQUE: 8.02 mCi F18  Piflufolastat (Pylarify) was injected intravenously. Full-ring PET imaging was performed from the skull base to thigh after the radiotracer. CT data was obtained and used for attenuation correction and anatomic localization.   COMPARISON:  None Available.   FINDINGS: NECK   No radiotracer activity in neck lymph nodes.   Incidental CT finding: None   CHEST   No radiotracer accumulation within mediastinal or hilar lymph nodes. No suspicious pulmonary nodules on the CT scan.   Incidental CT finding: Cardiac enlargement. Three vessel coronary artery calcifications. Aortic atherosclerosis. Small hiatal hernia.   ABDOMEN/PELVIS   Prostate: No focal activity in the prostate bed.   Lymph nodes: Soft tissue mass with central low attenuation along the right pelvic sidewall measures 2.8 x 4.1 cm with SUV max of 4.53, image 194/4.   Left pelvic sidewall lymph node measures 1.6 by 1.5 cm with SUV max of 2.84, image 195/4.   Liver: No evidence of liver metastasis   Incidental CT finding: None   SKELETON   No focal  activity to suggest skeletal metastasis.   IMPRESSION: 1. Right pelvic sidewall soft tissue mass with central low attenuation is tracer avid with SUV max of 4.53. Suspicious for necrotic lymph node metastasis. 2. Enlarged left pelvic sidewall lymph node with maximum short axis of 1.5 cm exhibits mild tracer uptake with SUV max of 2.84. Despite the mild tracer uptake morphologically this is suspicious for nodal metastasis. 3. No signs of distant metastatic disease. 4. Aortic Atherosclerosis (ICD10-I70.0). Coronary artery calcifications 5. Cardiac enlargement.     Electronically Signed   By: Signa Kell M.D.   On: 05/16/2022 08:14    Radiation Therapy   76.2 Gy in 13 fractions to the  prostate and lymph nodes   06/13/2022 Treatment Plan Change   Firmagon 240mg  06/18/2022-07/10/2022, Zytiga 1000mg  daily with prednisone beginning 06/13/2022   08/08/2022 Treatment  Plan Change   Eligard (4 month) started + Zytiga 1000 mg daily with Prednisone--contingued     CURRENT THERAPY: Eligard, Zytiga, Prednisone  INTERVAL HISTORY: Mark BottsDonald Figueroa 62 y.o. male returns for follow-up of his locally advanced prostate cancer.  He is doing moderately well.  He tolerates the Eligard every 4 months, Zytiga, and prednisone quite well he denies any significant side effects.  He has no urinary issues.  He does struggle with erectile dysfunction from time to time and takes Cialis which helps occasionally.  Previously seeing urology for his erectile dysfunction however he stopped doing that because the cost was too high.  His liver enzymes are slightly elevated today.  He denies any abdominal pain or discomfort however he does drink 6 beers every day.   Patient Active Problem List   Diagnosis Date Noted   Hypertension 04/11/2023   Hypercholesterolemia 04/11/2023   Strain of muscle, fascia and tendon of other parts of biceps, right arm, sequela 06/14/2022   Contusion of elbow 06/14/2022   Prostate cancer 11/22/2021    has No Known Allergies.  MEDICAL HISTORY: Past Medical History:  Diagnosis Date   Hypertension     SURGICAL HISTORY: Past Surgical History:  Procedure Laterality Date   left arm biceps surgery     PELVIC LYMPH NODE DISSECTION Bilateral 11/22/2021   Procedure: BILATERAL PELVIC LYMPH NODE DISSECTION;  Surgeon: Crist FatHerrick, Benjamin W, MD;  Location: [facility];  Service: Urology;  Laterality: Bilateral;   ROBOT ASSISTED LAPAROSCOPIC RADICAL PROSTATECTOMY N/A 11/22/2021   Procedure: XI ROBOTIC ASSISTED LAPAROSCOPIC RADICAL PROSTATECTOMY;  Surgeon: Crist FatHerrick, Benjamin W, MD;  Location: [facility];  Service: Urology;  Laterality: N/A;  REQUESTING 4.5 HRS    SOCIAL HISTORY: Social History   Socioeconomic History   Marital status: Married    Spouse name: Not on file   Number of children: Not on file   Years of education: Not on file   Highest education level:  Not on file  Occupational History   Not on file  Tobacco Use   Smoking status: Never   Smokeless tobacco: Never  Vaping Use   Vaping Use: Never used  Substance and Sexual Activity   Alcohol use: Yes    Comment: 5 cans nightly   Drug use: Never   Sexual activity: Not on file  Other Topics Concern   Not on file  Social History Narrative   Not on file   Social Determinants of Health   Financial Resource Strain: Not on file  Food Insecurity: Not on file  Transportation Needs: Not on file  Physical Activity: Not on file  Stress: Not on file  Social Connections: Not on file  Intimate Partner Violence: Not on file    FAMILY HISTORY: Family History  Problem Relation Age of Onset   Cancer - Prostate Maternal Uncle    Breast cancer Neg Hx    Pancreatic cancer Neg Hx    Ovarian cancer Neg Hx     Review of Systems  Constitutional:  Negative for appetite change, chills, fatigue, fever and unexpected weight change.  HENT:   Negative for hearing loss, lump/mass and trouble swallowing.   Eyes:  Negative for eye problems and icterus.  Respiratory:  Negative for chest tightness, cough and shortness of breath.   Cardiovascular:  Negative for chest pain, leg swelling and palpitations.  Gastrointestinal:  Negative for abdominal distention, abdominal pain, constipation, diarrhea, nausea and vomiting.  Endocrine: Negative for hot flashes.  Genitourinary:  Negative for difficulty urinating.   Musculoskeletal:  Negative for arthralgias.  Skin:  Negative for itching and rash.  Neurological:  Negative for dizziness, extremity weakness, headaches and numbness.  Hematological:  Negative for adenopathy. Does not bruise/bleed easily.  Psychiatric/Behavioral:  Negative for depression. The patient is not nervous/anxious.       PHYSICAL EXAMINATION   Onc Performance Status - 04/11/23 1153       ECOG Perf Status   ECOG Perf Status Fully active, able to carry on all pre-disease performance  without restriction      KPS SCALE   KPS % SCORE Normal, no compliants, no evidence of disease             Vitals:   04/11/23 1149  BP: 111/69  Pulse: 72  Resp: 16  Temp: 97.7 F (36.5 C)  SpO2: 98%    Physical Exam Constitutional:      General: He is not in acute distress.    Appearance: Normal appearance. He is not toxic-appearing.  HENT:     Head: Normocephalic and atraumatic.  Eyes:     General: No scleral icterus. Cardiovascular:     Rate and Rhythm: Normal rate and regular rhythm.     Pulses: Normal pulses.     Heart sounds: Normal heart sounds.  Pulmonary:     Effort: Pulmonary effort is normal.     Breath sounds: Normal breath sounds.  Abdominal:     General: Abdomen is flat. Bowel sounds are normal. There is no distension.     Palpations: Abdomen is soft.     Tenderness: There is no abdominal tenderness.  Musculoskeletal:        General: No swelling.     Cervical back: Neck supple.  Lymphadenopathy:     Cervical: No cervical adenopathy.  Skin:    General: Skin is warm and dry.     Findings: No rash.  Neurological:     General: No focal deficit present.     Mental Status: He is alert.  Psychiatric:        Mood and Affect: Mood normal.        Behavior: Behavior normal.     LABORATORY DATA:  CBC    Component Value Date/Time   WBC 4.2 04/11/2023 1054   WBC 10.2 11/23/2021 0342   RBC 5.09 04/11/2023 1054   HGB 14.4 04/11/2023 1054   HCT 38.5 (L) 04/11/2023 1054   PLT 236 04/11/2023 1054   MCV 75.6 (L) 04/11/2023 1054   MCH 28.3 04/11/2023 1054   MCHC 37.4 (H) 04/11/2023 1054   RDW 14.5 04/11/2023 1054   LYMPHSABS 0.6 (L) 04/11/2023 1054   MONOABS 0.5 04/11/2023 1054   EOSABS 0.1 04/11/2023 1054   BASOSABS 0.0 04/11/2023 1054    CMP     Component Value Date/Time   NA 139 04/11/2023 1054   K 3.7 04/11/2023 1054   CL 103 04/11/2023 1054   CO2 29 04/11/2023 1054   GLUCOSE 93 04/11/2023 1054   BUN 17 04/11/2023 1054   CREATININE  0.92 04/11/2023 1054   CALCIUM 9.9 04/11/2023 1054   PROT 7.2 04/11/2023 1054   ALBUMIN 4.1 04/11/2023 1054   AST 56 (H) 04/11/2023 1054   ALT 91 (H) 04/11/2023 1054   ALKPHOS 57 04/11/2023 1054   BILITOT 0.8 04/11/2023 1054   GFRNONAA >60 04/11/2023 1054  ASSESSMENT and THERAPY PLAN:   Prostate cancer (HCC) He continues on Eligard, Zytiga, and prednisone with good tolerance.  He will continue on this at this point.  His PSA is pending for today.  He has a mild elevation in his liver enzymes.  I recommended that he decrease his alcohol intake to max of 2 drinks per day instead of 6.  We will wait on his PSA to determine what type of imaging to order and when to repeat his liver enzymes.  He verbalized understanding of this and is in agreement with this plan.  Will return in 4 months to see Dr. Pamelia Hoit for labs, f/u, and his next injection  The above was reviewed with Dr. Pamelia Hoit who agrees with the above and will see him back in August.   All questions were answered. The patient knows to call the clinic with any problems, questions or concerns. We can certainly see the patient much sooner if necessary.  Total encounter time:30 minutes*in face-to-face visit time, chart review, lab review, care coordination, order entry, and documentation of the encounter time.    Lillard Anes, NP 04/11/23 2:06 PM Medical Oncology and Hematology [facility] [address] [place], [place] [address] Tel. [phone]    Fax. [phone]  *Total Encounter Time as defined by the Centers for Medicare and Medicaid Services includes, in addition to the face-to-face time of a patient visit (documented in the note above) non-face-to-face time: obtaining and reviewing outside history, ordering and reviewing medications, tests or procedures, care coordination (communications with other health care professionals or caregivers) and documentation in the medical record.

## 2023-04-11 NOTE — Assessment & Plan Note (Signed)
He continues on Eligard, Zytiga, and prednisone with good tolerance.  He will continue on this at this point.  His PSA is pending for today.  He has a mild elevation in his liver enzymes.  I recommended that he decrease his alcohol intake to max of 2 drinks per day instead of 6.  We will wait on his PSA to determine what type of imaging to order and when to repeat his liver enzymes.  He verbalized understanding of this and is in agreement with this plan.  Will return in 4 months to see Dr. Pamelia Hoit for labs, f/u, and his next injection

## 2023-04-12 ENCOUNTER — Telehealth: Payer: Self-pay

## 2023-04-12 ENCOUNTER — Other Ambulatory Visit: Payer: Self-pay

## 2023-04-12 ENCOUNTER — Other Ambulatory Visit: Payer: Self-pay | Admitting: Adult Health

## 2023-04-12 DIAGNOSIS — F1010 Alcohol abuse, uncomplicated: Secondary | ICD-10-CM

## 2023-04-12 DIAGNOSIS — R7989 Other specified abnormal findings of blood chemistry: Secondary | ICD-10-CM

## 2023-04-12 LAB — PROSTATE-SPECIFIC AG, SERUM (LABCORP): Prostate Specific Ag, Serum: <0.1 ng/mL (ref 0.0–4.0)

## 2023-04-12 NOTE — Progress Notes (Signed)
Recommendation sent to nursing to help coordinate liver ultrasound for patient with elevated LFTs, and 6 beer/day intake, along with repeat HFP in 2 weeks.His PSA has remained undetectable and is <0.1.  Lillard Anes, NP 04/12/23 1:55 PM Medical Oncology and Hematology [facility] [address] [place], [place] [address] Tel. [phone]    Fax. [phone]

## 2023-04-12 NOTE — Telephone Encounter (Signed)
Returned Pt's call and gave below message per NP:  "PSA looks good. I recommend he limit his ETOH and tylenol and repeat hepatic function panel in 2 weeks. I ordered an ultrasound that can ID damage from cirrhosis or fatty liver."  [place] and repeat lab appt scheduled. Pt verbalized understanding.

## 2023-04-19 ENCOUNTER — Other Ambulatory Visit: Payer: Self-pay | Admitting: Hematology and Oncology

## 2023-04-19 DIAGNOSIS — C61 Malignant neoplasm of prostate: Secondary | ICD-10-CM

## 2023-04-22 ENCOUNTER — Inpatient Hospital Stay: Payer: BC Managed Care – PPO

## 2023-04-22 ENCOUNTER — Telehealth: Payer: Self-pay

## 2023-04-22 ENCOUNTER — Ambulatory Visit (HOSPITAL_COMMUNITY)
Admission: RE | Admit: 2023-04-22 | Discharge: 2023-04-22 | Disposition: A | Discharge status: Home / Self Care | Payer: BC Managed Care – PPO | Source: Ambulatory Visit | Attending: Adult Health | Admitting: Adult Health

## 2023-04-22 DIAGNOSIS — R7989 Other specified abnormal findings of blood chemistry: Secondary | ICD-10-CM | POA: Insufficient documentation

## 2023-04-22 DIAGNOSIS — F1010 Alcohol abuse, uncomplicated: Secondary | ICD-10-CM | POA: Diagnosis present

## 2023-04-22 DIAGNOSIS — C61 Malignant neoplasm of prostate: Secondary | ICD-10-CM | POA: Diagnosis not present

## 2023-04-22 LAB — CMP (CANCER CENTER ONLY)
ALT: 64 U/L — ABNORMAL HIGH (ref 0–44)
AST: 39 U/L (ref 15–41)
Albumin: 4 g/dL (ref 3.5–5.0)
Alkaline Phosphatase: 51 U/L (ref 38–126)
Anion gap: 6 (ref 5–15)
BUN: 12 mg/dL (ref 8–23)
CO2: 30 mmol/L (ref 22–32)
Calcium: 9.9 mg/dL (ref 8.9–10.3)
Chloride: 104 mmol/L (ref 98–111)
Creatinine: 0.85 mg/dL (ref 0.61–1.24)
GFR, Estimated: >60 mL/min (ref >60)
Glucose, Bld: 101 mg/dL — ABNORMAL HIGH (ref 70–99)
Potassium: 3.4 mmol/L — ABNORMAL LOW (ref 3.5–5.1)
Sodium: 140 mmol/L (ref 135–145)
Total Bilirubin: 0.9 mg/dL (ref 0.3–1.2)
Total Protein: 7.2 g/dL (ref 6.5–8.1)

## 2023-04-22 LAB — CBC WITH DIFFERENTIAL (CANCER CENTER ONLY)
Abs Immature Granulocytes: 0.02 10*3/uL (ref 0.00–0.07)
Basophils Absolute: 0.1 10*3/uL (ref 0.0–0.1)
Basophils Relative: 1 %
Eosinophils Absolute: 0.1 10*3/uL (ref 0.0–0.5)
Eosinophils Relative: 2 %
HCT: 39.1 % (ref 39.0–52.0)
Hemoglobin: 14.3 g/dL (ref 13.0–17.0)
Immature Granulocytes: 0 %
Lymphocytes Relative: 8 %
Lymphs Abs: 0.5 10*3/uL — ABNORMAL LOW (ref 0.7–4.0)
MCH: 28.2 pg (ref 26.0–34.0)
MCHC: 36.6 g/dL — ABNORMAL HIGH (ref 30.0–36.0)
MCV: 77.1 fL — ABNORMAL LOW (ref 80.0–100.0)
Monocytes Absolute: 0.5 10*3/uL (ref 0.1–1.0)
Monocytes Relative: 9 %
Neutro Abs: 4.6 10*3/uL (ref 1.7–7.7)
Neutrophils Relative %: 80 %
Platelet Count: 233 10*3/uL (ref 150–400)
RBC: 5.07 MIL/uL (ref 4.22–5.81)
RDW: 14.3 % (ref 11.5–15.5)
WBC Count: 5.7 10*3/uL (ref 4.0–10.5)
nRBC: 0 % (ref 0.0–0.2)

## 2023-04-22 NOTE — Telephone Encounter (Signed)
-----   Message from Loa Socks, NP sent at 04/22/2023  1:51 PM EDT ----- Ultrasound shows no cancer, but fatty liver, his liver enzymes have decreased likely due to decreasing ETOH.  Please recommend that he continue to be mindful of ETOH intake, and we will continue to monitor.  ----- Message ----- From: Interface, Rad Results In Sent: 04/22/2023  11:07 AM EDT To: Loa Socks, NP

## 2023-04-22 NOTE — Telephone Encounter (Signed)
Spoke with patient regarding message below per Lillard Anes NP. Patient verbalized understanding.

## 2023-04-23 LAB — PROSTATE-SPECIFIC AG, SERUM (LABCORP): Prostate Specific Ag, Serum: <0.1 ng/mL (ref 0.0–4.0)

## 2023-04-24 ENCOUNTER — Other Ambulatory Visit: Payer: BC Managed Care – PPO

## 2023-05-17 ENCOUNTER — Other Ambulatory Visit: Payer: Self-pay | Admitting: Hematology and Oncology

## 2023-05-17 DIAGNOSIS — C61 Malignant neoplasm of prostate: Secondary | ICD-10-CM

## 2023-05-20 ENCOUNTER — Encounter: Payer: Self-pay | Admitting: Hematology and Oncology

## 2023-06-13 ENCOUNTER — Other Ambulatory Visit: Payer: Self-pay | Admitting: Adult Health

## 2023-06-13 DIAGNOSIS — C61 Malignant neoplasm of prostate: Secondary | ICD-10-CM

## 2023-07-03 ENCOUNTER — Other Ambulatory Visit: Payer: Self-pay | Admitting: Adult Health

## 2023-07-03 DIAGNOSIS — C61 Malignant neoplasm of prostate: Secondary | ICD-10-CM

## 2023-07-18 ENCOUNTER — Telehealth: Payer: Self-pay | Admitting: Pharmacy Technician

## 2023-07-18 NOTE — Telephone Encounter (Signed)
Oral Oncology Patient Advocate Encounter  Received fax from patient's PBM, [employer]. Per plan, patient is now required to fill their abiraterone at [facility].  I have spoken with the patient regarding this change and the process for setting up an account at this pharmacy.  Patient's email is [email or web address]  Jinger Neighbors, CPhT-Adv Oncology Pharmacy Patient Advocate [facility] Direct Number: [phone]  Fax: [phone]

## 2023-07-19 ENCOUNTER — Other Ambulatory Visit: Payer: Self-pay | Admitting: *Deleted

## 2023-07-19 DIAGNOSIS — C61 Malignant neoplasm of prostate: Secondary | ICD-10-CM

## 2023-07-19 MED ORDER — ABIRATERONE ACETATE 250 MG PO TABS
1000.0000 mg | ORAL_TABLET | Freq: Every day | ORAL | 1 refills | Status: DC
Start: 2023-07-19 — End: 2023-08-13

## 2023-07-19 MED ORDER — PREDNISONE 5 MG PO TABS: 5.0000 mg | ORAL_TABLET | Freq: Every day | ORAL | 1 refills | Status: AC

## 2023-07-25 IMAGING — PT NM PET TUM IMG SKULL BASE T - THIGH
1 series · 2 of 2 positions shown · non-contrast
Comparison: None Available.

CLINICAL DATA: Prostate carcinoma with biochemical recurrence.

EXAM:
NUCLEAR MEDICINE PET SKULL BASE TO THIGH
TECHNIQUE: 8.02 mCi F18 Piflufolastat (Pylarify) was injected intravenously.
Full-ring PET imaging was performed from the skull base to thigh
after the radiotracer. CT data was obtained and used for attenuation
correction and anatomic localization.

[Series 1093: results mm oncology reading · 1.0mm · 0.45mm/px · 2 of 2 slices shown]
[im 1/2]
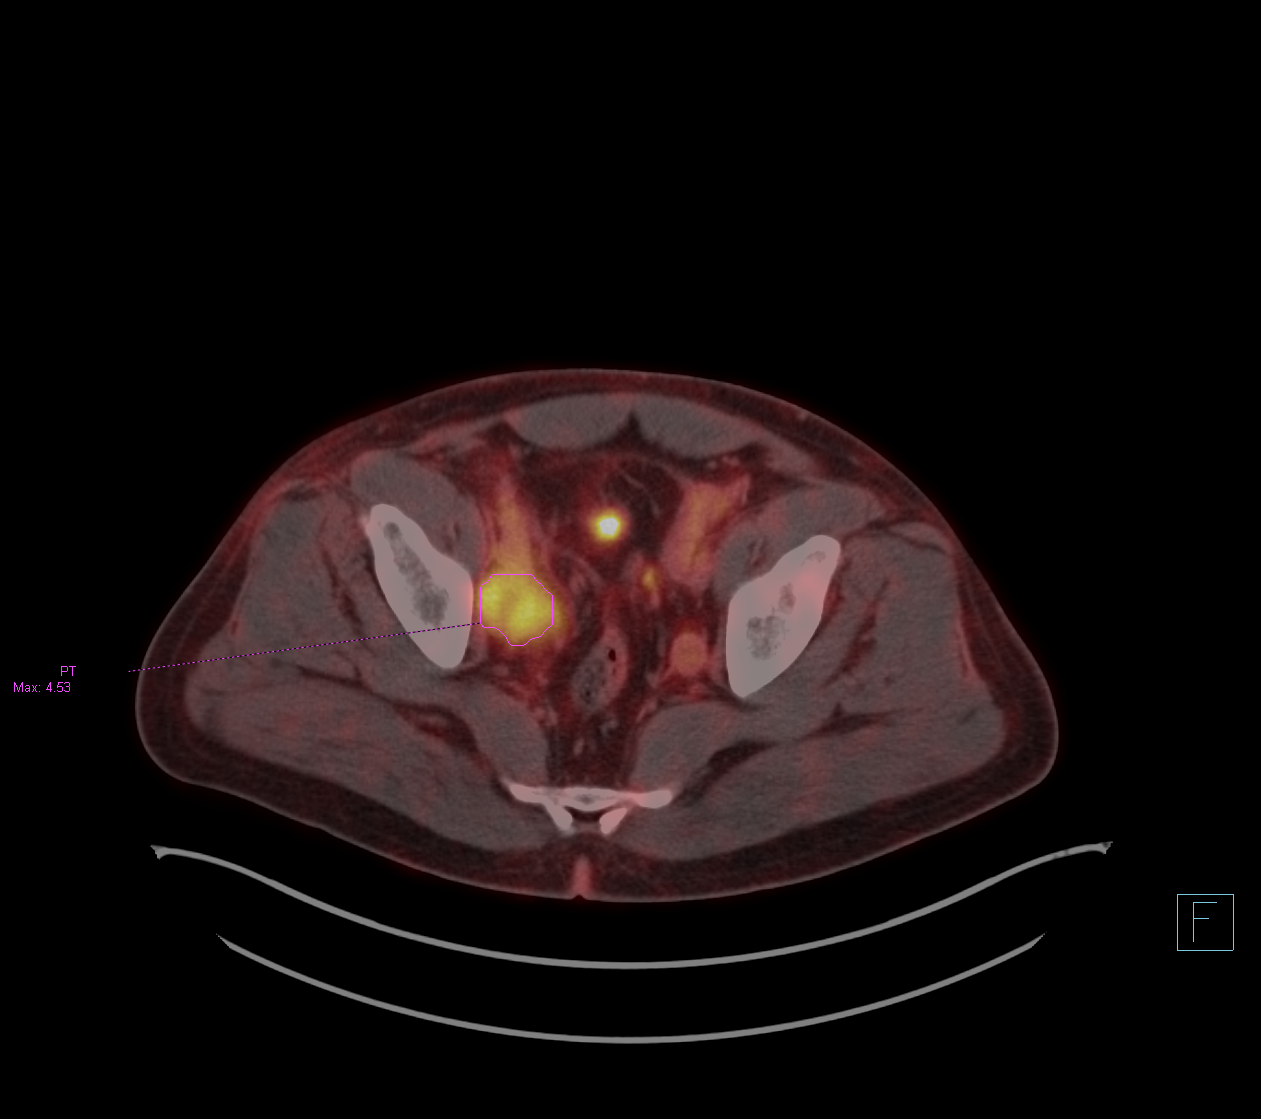
[im 2/2]
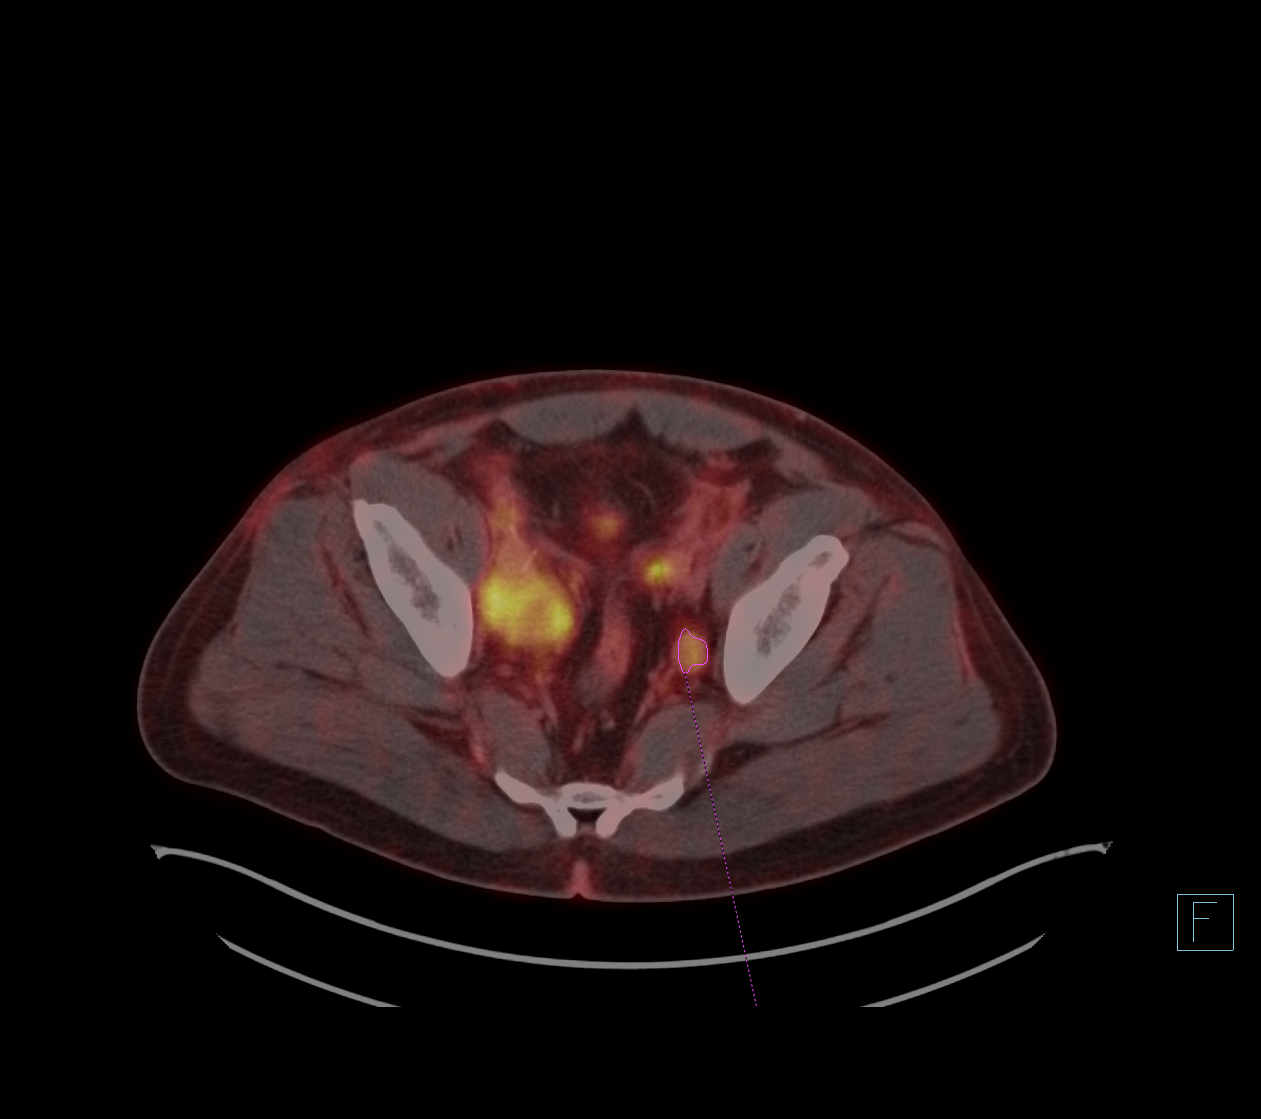

[2 of 2 positions shown; findings below may reference images not displayed]

FINDINGS: NECK

No radiotracer activity in neck lymph nodes.

Incidental CT finding: None

CHEST

No radiotracer accumulation within mediastinal or hilar lymph nodes.
No suspicious pulmonary nodules on the CT scan.

Incidental CT finding: Cardiac enlargement. Three vessel coronary
artery calcifications. Aortic atherosclerosis. Small hiatal hernia.

ABDOMEN/PELVIS

Prostate: No focal activity in the prostate bed.

Lymph nodes: Soft tissue mass with central low attenuation along the
right pelvic sidewall measures 2.8 x 4.1 cm with SUV max of 4.53,
image 194/4.

Left pelvic sidewall lymph node measures 1.6 by 1.5 cm with SUV max
of 2.84, image 195/4.

Liver: No evidence of liver metastasis

Incidental CT finding: None

SKELETON

No focal  activity to suggest skeletal metastasis.
IMPRESSION: 1. Right pelvic sidewall soft tissue mass with central low
attenuation is tracer avid with SUV max of 4.53. Suspicious for
necrotic lymph node metastasis.
2. Enlarged left pelvic sidewall lymph node with maximum short axis
of 1.5 cm exhibits mild tracer uptake with SUV max of 2.84. Despite
the mild tracer uptake morphologically this is suspicious for nodal
metastasis.
3. No signs of distant metastatic disease.
4. Aortic Atherosclerosis ([id number]). Coronary artery
calcifications
5. Cardiac enlargement.

## 2023-08-05 ENCOUNTER — Telehealth: Payer: Self-pay

## 2023-08-05 NOTE — Telephone Encounter (Signed)
Pt called with concerns for his medication going to the [facility], stating he was informed he had to go online and register and does not know how to do this. Advised pt I would reach out to our pharmacy team for assistance.

## 2023-08-06 ENCOUNTER — Telehealth: Payer: Self-pay

## 2023-08-06 NOTE — Telephone Encounter (Signed)
Oral Oncology Pharmacist Encounter   Received message from Vernona Rieger, RN regarding [facility] questions from patient. Called patient to walk him through the process and he states that he will come into the office sometime and have someone help him do this through google. Patient did not want to do it over the phone and wants someone to help him in person. I discussed with Vernona Rieger on how to sign up as he needs to visit [email or web address] and click the sign up button.   Bethel Born, PharmD Hematology/Oncology Clinical Pharmacist [facility] Oral Chemotherapy Navigation [facility] [phone] 08/06/2023 8:22 AM

## 2023-08-12 NOTE — Telephone Encounter (Signed)
Oral Oncology Patient Advocate Encounter  Called and left a vm with my contact information for patient to follow up regarding issues with creating a [facility] account. An email was sent to [email or web address] with sign up instructions on 07/18/23 but can be resent at any time should patient request.  I also called and confirmed with his PBM, [employer], that this is the pharmacy he must use going forward. Representative, [name], stated this was the case and that patient could contact the plan at [phone] for assistance with pharmacy enrollment as well.  Jinger Neighbors, CPhT-Adv Oncology Pharmacy Patient Advocate [facility] Direct Number: [phone]  Fax: [phone]

## 2023-08-13 ENCOUNTER — Other Ambulatory Visit: Payer: Self-pay

## 2023-08-13 ENCOUNTER — Inpatient Hospital Stay: Payer: BC Managed Care – PPO

## 2023-08-13 ENCOUNTER — Inpatient Hospital Stay: Payer: BC Managed Care – PPO | Attending: Hematology and Oncology

## 2023-08-13 ENCOUNTER — Other Ambulatory Visit (HOSPITAL_COMMUNITY): Payer: Self-pay

## 2023-08-13 ENCOUNTER — Inpatient Hospital Stay: Payer: BC Managed Care – PPO | Admitting: Hematology and Oncology

## 2023-08-13 VITALS — BP 140/79 | HR 64 | Temp 97.7°F | Resp 16 | Wt 212.5 lb

## 2023-08-13 DIAGNOSIS — C61 Malignant neoplasm of prostate: Secondary | ICD-10-CM | POA: Diagnosis present

## 2023-08-13 DIAGNOSIS — C779 Secondary and unspecified malignant neoplasm of lymph node, unspecified: Secondary | ICD-10-CM | POA: Diagnosis not present

## 2023-08-13 DIAGNOSIS — I10 Essential (primary) hypertension: Secondary | ICD-10-CM | POA: Insufficient documentation

## 2023-08-13 DIAGNOSIS — Z7952 Long term (current) use of systemic steroids: Secondary | ICD-10-CM | POA: Diagnosis not present

## 2023-08-13 DIAGNOSIS — Z923 Personal history of irradiation: Secondary | ICD-10-CM | POA: Diagnosis not present

## 2023-08-13 LAB — CMP (CANCER CENTER ONLY)
ALT: 23 U/L (ref 0–44)
AST: 21 U/L (ref 15–41)
Albumin: 4.2 g/dL (ref 3.5–5.0)
Alkaline Phosphatase: 59 U/L (ref 38–126)
Anion gap: 7 (ref 5–15)
BUN: 16 mg/dL (ref 8–23)
CO2: 29 mmol/L (ref 22–32)
Calcium: 9.5 mg/dL (ref 8.9–10.3)
Chloride: 107 mmol/L (ref 98–111)
Creatinine: 1.02 mg/dL (ref 0.61–1.24)
GFR, Estimated: >60 mL/min (ref >60)
Glucose, Bld: 99 mg/dL (ref 70–99)
Potassium: 3.8 mmol/L (ref 3.5–5.1)
Sodium: 143 mmol/L (ref 135–145)
Total Bilirubin: 0.6 mg/dL (ref 0.3–1.2)
Total Protein: 7.3 g/dL (ref 6.5–8.1)

## 2023-08-13 LAB — CBC WITH DIFFERENTIAL (CANCER CENTER ONLY)
Abs Immature Granulocytes: 0.02 10*3/uL (ref 0.00–0.07)
Basophils Absolute: 0 10*3/uL (ref 0.0–0.1)
Basophils Relative: 1 %
Eosinophils Absolute: 0 10*3/uL (ref 0.0–0.5)
Eosinophils Relative: 1 %
HCT: 40.6 % (ref 39.0–52.0)
Hemoglobin: 14.6 g/dL (ref 13.0–17.0)
Immature Granulocytes: 0 %
Lymphocytes Relative: 10 %
Lymphs Abs: 0.6 10*3/uL — ABNORMAL LOW (ref 0.7–4.0)
MCH: 27.7 pg (ref 26.0–34.0)
MCHC: 36 g/dL (ref 30.0–36.0)
MCV: 76.9 fL — ABNORMAL LOW (ref 80.0–100.0)
Monocytes Absolute: 0.4 10*3/uL (ref 0.1–1.0)
Monocytes Relative: 6 %
Neutro Abs: 4.8 10*3/uL (ref 1.7–7.7)
Neutrophils Relative %: 82 %
Platelet Count: 241 10*3/uL (ref 150–400)
RBC: 5.28 MIL/uL (ref 4.22–5.81)
RDW: 15 % (ref 11.5–15.5)
WBC Count: 5.8 10*3/uL (ref 4.0–10.5)
nRBC: 0 % (ref 0.0–0.2)

## 2023-08-13 MED ORDER — ABIRATERONE ACETATE 250 MG PO TABS
1000.0000 mg | ORAL_TABLET | Freq: Every day | ORAL | 3 refills | Status: DC
Start: 2023-08-13 — End: 2023-08-14

## 2023-08-13 MED ORDER — LEUPROLIDE ACETATE (4 MONTH) 30 MG ~~LOC~~ KIT
30.0000 mg | PACK | Freq: Once | SUBCUTANEOUS | Status: AC
  Administered 2023-08-13: 30 mg via SUBCUTANEOUS
  Filled 2023-08-13: qty 30

## 2023-08-13 NOTE — Progress Notes (Signed)
Patient Care Team: Pcp, No as PCP - General (Family Medicine) Serena Croissant, MD as Consulting Physician (Hematology and Oncology)  DIAGNOSIS:  Encounter Diagnosis  Name Primary?   Prostate cancer (HCC) Yes    SUMMARY OF ONCOLOGIC HISTORY: Oncology History  Prostate cancer (HCC)  09/07/2021 Cancer Staging   Staging form: Prostate, AJCC 8th Edition - Clinical stage from 09/07/2021: Stage IVA (cT3, cN1, cM0, PSA: 5, Grade Group: 5) - Signed by Marcello Fennel, PA-C on 06/14/2022 Histopathologic type: Adenocarcinoma, NOS Stage prefix: Initial diagnosis Prostate specific antigen (PSA) range: Less than 10 Gleason primary pattern: 4 Gleason secondary pattern: 5 Gleason score: 9 Histologic grading system: 5 grade system Number of biopsy cores examined: 12 Number of biopsy cores positive: 8 Location of positive needle core biopsies: Both sides   11/22/2021 Initial Diagnosis   Prostate cancer (HCC)   11/22/2021 Surgery   Robotic assisted laparoscopic radical prostatectomy and bilateral extensive lymph node dose dissection on November 22, 2021.  The final pathology showed prostate adenocarcinoma Gleason score 9 with extraprostatic extension and lymphovascular invasion.  Margins focally positive with a 0/8 total lymph nodes showed malignancy.  The final pathological staging was T3bN0.    05/16/2022 PET scan   CLINICAL DATA:  Prostate carcinoma with biochemical recurrence.   EXAM: NUCLEAR MEDICINE PET SKULL BASE TO THIGH   TECHNIQUE: 8.02 mCi F18 Piflufolastat (Pylarify) was injected intravenously. Full-ring PET imaging was performed from the skull base to thigh after the radiotracer. CT data was obtained and used for attenuation correction and anatomic localization.   COMPARISON:  None Available.   FINDINGS: NECK   No radiotracer activity in neck lymph nodes.   Incidental CT finding: None   CHEST   No radiotracer accumulation within mediastinal or hilar lymph nodes. No  suspicious pulmonary nodules on the CT scan.   Incidental CT finding: Cardiac enlargement. Three vessel coronary artery calcifications. Aortic atherosclerosis. Small hiatal hernia.   ABDOMEN/PELVIS   Prostate: No focal activity in the prostate bed.   Lymph nodes: Soft tissue mass with central low attenuation along the right pelvic sidewall measures 2.8 x 4.1 cm with SUV max of 4.53, image 194/4.   Left pelvic sidewall lymph node measures 1.6 by 1.5 cm with SUV max of 2.84, image 195/4.   Liver: No evidence of liver metastasis   Incidental CT finding: None   SKELETON   No focal  activity to suggest skeletal metastasis.   IMPRESSION: 1. Right pelvic sidewall soft tissue mass with central low attenuation is tracer avid with SUV max of 4.53. Suspicious for necrotic lymph node metastasis. 2. Enlarged left pelvic sidewall lymph node with maximum short axis of 1.5 cm exhibits mild tracer uptake with SUV max of 2.84. Despite the mild tracer uptake morphologically this is suspicious for nodal metastasis. 3. No signs of distant metastatic disease. 4. Aortic Atherosclerosis (ICD10-I70.0). Coronary artery calcifications 5. Cardiac enlargement.     Electronically Signed   By: Signa Kell M.D.   On: 05/16/2022 08:14    Radiation Therapy   76.2 Gy in 13 fractions to the prostate and lymph nodes   06/13/2022 Treatment Plan Change   Firmagon 240mg  06/18/2022-07/10/2022, Zytiga 1000mg  daily with prednisone beginning 06/13/2022   08/08/2022 Treatment Plan Change   Eligard (4 month) started + Zytiga 1000 mg daily with Prednisone--contingued     CHIEF COMPLIANT: Locally advanced prostate cancer  INTERVAL HISTORY: Mark Figueroa is a 62 y.o. male returns for follow-up of his locally advanced prostate cancer.  He is here today for a follow-up.  He is tolerating Eligard and Zytiga extremely well.  He does not have any side effects from the treatment.  His only issue is that he has only 1  more day of Zytiga left and there has been a change in the pharmacy and he has not been able to set up his profile with the new company.   ALLERGIES:  has No Known Allergies.  MEDICATIONS:  Current Outpatient Medications  Medication Sig Dispense Refill   abiraterone acetate (ZYTIGA) 250 MG tablet Take 4 tablets (1,000 mg total) by mouth daily. Take on an empty stomach 1 hour before or 2 hours after a meal 360 tablet 3   aspirin EC 81 MG tablet Take 81 mg by mouth daily. Swallow whole.     atorvastatin (LIPITOR) 20 MG tablet Take 20 mg by mouth daily.     hydrochlorothiazide (HYDRODIURIL) 25 MG tablet Take 25 mg by mouth daily.     losartan (COZAAR) 100 MG tablet Take 100 mg by mouth daily.     predniSONE (DELTASONE) 5 MG tablet Take 1 tablet (5 mg total) by mouth daily with breakfast. 90 tablet 1   tadalafil (CIALIS) 20 MG tablet Take 20 mg by mouth daily as needed for erectile dysfunction.     No current facility-administered medications for this visit.    PHYSICAL EXAMINATION: ECOG PERFORMANCE STATUS: 1 - Symptomatic but completely ambulatory  Vitals:   08/13/23 1457  BP: (!) 140/79  Pulse: 64  Resp: 16  Temp: 97.7 F (36.5 C)  SpO2: 100%   Filed Weights   08/13/23 1457  Weight: 212 lb 8 oz (96.4 kg)     LABORATORY DATA:  I have reviewed the data as listed    Latest Ref Rng & Units 04/22/2023   10:42 AM 04/11/2023   10:54 AM 12/06/2022    9:02 AM  CMP  Glucose 70 - 99 mg/dL 161  93  82   BUN 8 - 23 mg/dL 12  17  10    Creatinine 0.61 - 1.24 mg/dL 0.96  0.45  4.09   Sodium 135 - 145 mmol/L 140  139  142   Potassium 3.5 - 5.1 mmol/L 3.4  3.7  3.8   Chloride 98 - 111 mmol/L 104  103  104   CO2 22 - 32 mmol/L 30  29  32   Calcium 8.9 - 10.3 mg/dL 9.9  9.9  9.6   Total Protein 6.5 - 8.1 g/dL 7.2  7.2  7.2   Total Bilirubin 0.3 - 1.2 mg/dL 0.9  0.8  0.7   Alkaline Phos 38 - 126 U/L 51  57  56   AST 15 - 41 U/L 39  56  24   ALT 0 - 44 U/L 64  91  32     Lab Results   Component Value Date   WBC 5.8 08/13/2023   HGB 14.6 08/13/2023   HCT 40.6 08/13/2023   MCV 76.9 (L) 08/13/2023   PLT 241 08/13/2023   NEUTROABS 4.8 08/13/2023    ASSESSMENT & PLAN:  Prostate cancer (HCC) 11/22/2021: Robotic assisted laparoscopic radical prostatectomy and bilateral extensive lymph node dissection.  Prostate adenocarcinoma Gleason score 9 with extraprostatic extension and lymphovascular invasion, margins focally positive, 0/8 lymph nodes negative T3b N0 04/16/2022: PSA 28 (soft tissue mass right pelvic sidewall 4.1 cm and a left pelvic sidewall lymph node 1.6 cm) 06/18/2022-07/10/2022: Mark Figueroa to 40 mg September 2023: Radiation to prostate  and involved lymph nodes 76.2 Gray in 13 fractions  Current treatment: Eligard 30 mg every 4 months started 08/04/2022 with Zytiga 1000 mg daily with prednisone started 06/13/2022  Toxicities: He is tolerating the treatment extremely well without any problems or concerns.  Treatment plan: Continue with the current treatment of Eligard with Zytiga and prednisone. PSA is being monitored: 04/22/2023: PSA is less than 0.1 Patient will follow-up with Dr. Cherly Hensen in 3 months.   No orders of the defined types were placed in this encounter.  The patient has a good understanding of the overall plan. he agrees with it. he will call with any problems that may develop before the next visit here. Total time spent: 30 mins including face to face time and time spent for planning, charting and co-ordination of care   Tamsen Meek, MD 08/13/23    I Janan Ridge am acting as a Neurosurgeon for [employer]  I have reviewed the above documentation for accuracy and completeness, and I agree with the above.

## 2023-08-13 NOTE — Assessment & Plan Note (Addendum)
11/22/2021: Robotic assisted laparoscopic radical prostatectomy and bilateral extensive lymph node dissection.  Prostate adenocarcinoma Gleason score 9 with extraprostatic extension and lymphovascular invasion, margins focally positive, 0/8 lymph nodes negative T3b N0 04/16/2022: PSA 28 (soft tissue mass right pelvic sidewall 4.1 cm and a left pelvic sidewall lymph node 1.6 cm) 06/18/2022-07/10/2022: Deborra Medina to 40 mg September 2023: Radiation to prostate and involved lymph nodes 76.2 Gray in 13 fractions  Current treatment: Eligard 30 mg every 4 months started 08/04/2022 with Zytiga 1000 mg daily with prednisone started 06/13/2022  Toxicities: He is tolerating the treatment extremely well without any problems or concerns.  Treatment plan: Continue with the current treatment of Eligard with Zytiga and prednisone. PSA is being monitored: 04/22/2023: PSA is less than 0.1 Patient will follow-up with Dr. Cherly Hensen in 3 months.

## 2023-08-14 ENCOUNTER — Other Ambulatory Visit (HOSPITAL_COMMUNITY): Payer: Self-pay

## 2023-08-14 ENCOUNTER — Telehealth: Payer: Self-pay

## 2023-08-14 DIAGNOSIS — C61 Malignant neoplasm of prostate: Secondary | ICD-10-CM

## 2023-08-14 MED ORDER — ABIRATERONE ACETATE 250 MG PO TABS
1000.0000 mg | ORAL_TABLET | Freq: Every day | ORAL | 3 refills | Status: AC
Start: 2023-08-14 — End: ?

## 2023-08-14 NOTE — Telephone Encounter (Signed)
Per pt's insurance, he must receive Zytiga via [facility] ([place] [facility]). Insurance will not allow 90 day supply. MD sent new order for 30 days. Pt was assisted in setting up account 8/13 by this nurse. D/t insurance running 90 day supply, there is a delay in the process of receiving his medication as pharmacy states to wait at least 24 hours to resubmit request. Pt is aware of this. He is now out of his medication and is aware he will unfortunately miss doses d/t this delay.  He has verbalized he will need help from nursing every month when time to renew as he is [place] and does not know how to login to [email or web address] despite spending 45 mins of face to face time with patient showing and explaining how to access his account.   We will continue to follow progress for this pt's need for medication.

## 2023-08-15 ENCOUNTER — Telehealth: Payer: Self-pay | Admitting: Pharmacy Technician

## 2023-08-15 NOTE — Telephone Encounter (Signed)
Oral Oncology Pharmacist Encounter   Prescription for Zytiga modified after okay from Dr. Pamelia Hoit to change to 120 tablets instead of 360 tablets so that patient can fill through [facility] using the insurance. Dr. Pamelia Hoit cosigned the order.   See encounter by Jinger Neighbors about current issues regarding [facility].   Bethel Born, PharmD Hematology/Oncology Clinical Pharmacist [facility] [phone]

## 2023-08-15 NOTE — Telephone Encounter (Signed)
Oral Oncology Patient Advocate Encounter  Received fax 07/17/23 stating patient must now fill abiraterone through [facility]. Spoke with patient at that time regarding the process for making an account and using this pharmacy.  While attempting to order medication for the patient this month, received a rejection in the [facility] portal stating the patient's insurance is not contracted with their pharmacy.  Spoke with [employer], patient's PBM, on Monday 08/13/23 and confirmed with a plan representative that [facility] is the correct pharmacy for this patient's abiraterone.   Called [employer] an additional time today as the reject is still occurring with [facility] when patient attempts to fill medication. [employer] rep stated they will place a ticket and call [facility] directly to try and resolve this issue. I am now awaiting a return call from [employer] with a status update of the case.  I advised the [employer] rep that this is urgent as patient is now out of medication and needs to receive medication as promptly as possible.   [employer] phone [phone]  [facility] phone [phone]  Jinger Neighbors, CPhT-Adv Oncology Pharmacy Patient Advocate [facility] Direct Number: [phone]  Fax: [phone]

## 2023-08-15 NOTE — Telephone Encounter (Signed)
Oral Oncology Patient Advocate Encounter  Spoke with [employer] an additional time. Was provided ticket # [id number] that has been marked as urgent by the plan representative.  Per plan, no overrides can be placed to fill the medication at a different pharmacy at this time. I will continue to follow up until this issue has been resolved.  Jinger Neighbors, CPhT-Adv Oncology Pharmacy Patient Advocate [facility] Direct Number: [phone]  Fax: [phone]

## 2023-08-16 NOTE — Telephone Encounter (Signed)
Oral Oncology Patient Advocate Encounter  Check [facility] pharmacy portal and confirmed patient was able to order medication successfully.   Patient's co-pay is $10.  Shipping is $0 for standard shipping.  Order# [id number]  Jinger Neighbors, CPhT-Adv Oncology Pharmacy Patient Advocate [facility] Direct Number: [phone]  Fax: [phone]

## 2024-04-14 ENCOUNTER — Other Ambulatory Visit (HOSPITAL_COMMUNITY): Payer: Self-pay | Admitting: Urology

## 2024-04-14 DIAGNOSIS — C775 Secondary and unspecified malignant neoplasm of intrapelvic lymph nodes: Secondary | ICD-10-CM

## 2024-04-14 DIAGNOSIS — C61 Malignant neoplasm of prostate: Secondary | ICD-10-CM

## 2024-05-05 ENCOUNTER — Encounter (HOSPITAL_COMMUNITY): Payer: Self-pay

## 2024-05-05 ENCOUNTER — Ambulatory Visit (HOSPITAL_COMMUNITY): Admission: RE | Admit: 2024-05-05 | Source: Ambulatory Visit

## 2024-05-05 ENCOUNTER — Other Ambulatory Visit (HOSPITAL_COMMUNITY)

## 2024-05-05 ENCOUNTER — Ambulatory Visit (HOSPITAL_COMMUNITY)

## 2024-05-21 ENCOUNTER — Ambulatory Visit (HOSPITAL_COMMUNITY)
Admission: RE | Admit: 2024-05-21 | Discharge: 2024-05-21 | Disposition: A | Discharge status: Home / Self Care | Source: Ambulatory Visit | Attending: Urology | Admitting: Urology

## 2024-05-21 DIAGNOSIS — C61 Malignant neoplasm of prostate: Secondary | ICD-10-CM

## 2024-05-21 DIAGNOSIS — C775 Secondary and unspecified malignant neoplasm of intrapelvic lymph nodes: Secondary | ICD-10-CM | POA: Diagnosis present

## 2024-05-21 MED ORDER — TECHNETIUM TC 99M MEDRONATE IV KIT
20.0000 | PACK | Freq: Once | INTRAVENOUS | Status: AC | PRN
  Administered 2024-05-21: 18.3 via INTRAVENOUS

## 2024-05-21 MED ORDER — SODIUM CHLORIDE (PF) 0.9 % IJ SOLN
INTRAMUSCULAR | Status: AC
  Filled 2024-05-21: qty 50

## 2024-05-21 MED ORDER — IOHEXOL 300 MG/ML  SOLN
100.0000 mL | Freq: Once | INTRAMUSCULAR | Status: AC | PRN
  Administered 2024-05-21: 100 mL via INTRAVENOUS
